# Patient Record
Sex: Male | Born: 1975
Health system: Southern US, Community
[De-identification: ages and names within clinical notes are randomized; demographics above are authoritative.]

## PROBLEM LIST (undated history)

## (undated) DIAGNOSIS — F419 Anxiety disorder, unspecified: Secondary | ICD-10-CM

## (undated) DIAGNOSIS — J45909 Unspecified asthma, uncomplicated: Secondary | ICD-10-CM

## (undated) HISTORY — PX: APPENDECTOMY: SHX54

---

## 2000-04-21 ENCOUNTER — Emergency Department (HOSPITAL_COMMUNITY): Admission: EM | Admit: 2000-04-21 | Discharge: 2000-04-21 | Payer: Self-pay | Admitting: Emergency Medicine

## 2000-06-07 ENCOUNTER — Emergency Department (HOSPITAL_COMMUNITY): Admission: EM | Admit: 2000-06-07 | Discharge: 2000-06-07 | Payer: Self-pay | Admitting: Emergency Medicine

## 2002-10-23 ENCOUNTER — Encounter: Payer: Self-pay | Admitting: *Deleted

## 2002-10-23 ENCOUNTER — Encounter (INDEPENDENT_AMBULATORY_CARE_PROVIDER_SITE_OTHER): Payer: Self-pay | Admitting: *Deleted

## 2002-10-23 ENCOUNTER — Inpatient Hospital Stay (HOSPITAL_COMMUNITY): Admission: EM | Admit: 2002-10-23 | Discharge: 2002-10-25 | Payer: Self-pay | Admitting: Emergency Medicine

## 2004-04-12 ENCOUNTER — Emergency Department (HOSPITAL_COMMUNITY): Admission: EM | Admit: 2004-04-12 | Discharge: 2004-04-12 | Payer: Self-pay | Admitting: Emergency Medicine

## 2010-03-07 ENCOUNTER — Emergency Department (HOSPITAL_COMMUNITY)
Admission: EM | Admit: 2010-03-07 | Discharge: 2010-03-07 | Payer: Self-pay | Source: Home / Self Care | Admitting: Emergency Medicine

## 2010-03-13 ENCOUNTER — Emergency Department (HOSPITAL_COMMUNITY)
Admission: EM | Admit: 2010-03-13 | Discharge: 2010-03-13 | Payer: Self-pay | Source: Home / Self Care | Admitting: Emergency Medicine

## 2010-07-25 NOTE — Op Note (Signed)
NAME:  Steve Harmon, Steve Harmon                            ACCOUNT NO.:  [id number]   MEDICAL RECORD NO.:  [id number]                   PATIENT TYPE:  INP   LOCATION:  5729                                 FACILITY:  [facility]   PHYSICIAN:  Velora Heckler, M.D.                DATE OF BIRTH:  [birth year]   DATE OF PROCEDURE:  10/23/2002  DATE OF DISCHARGE:                                 OPERATIVE REPORT   PREOPERATIVE DIAGNOSIS:  Acute appendicitis.   POSTOPERATIVE DIAGNOSIS:  Acute appendicitis.   PROCEDURE:  Laparoscopic appendectomy.   SURGEON:  Velora Heckler, M.D.   ANESTHESIA:  General per Janetta Hora. Gelene Mink, M.D.   ESTIMATED BLOOD LOSS:  Minimal.   PREPARATION:  Betadine.   COMPLICATIONS:  None.   INDICATIONS FOR PROCEDURE:  The patient is a 35 year old white male who  presents to the emergency department with abdominal pain localizing to the  right lower quadrant. White blood cell count is elevated at 16,000. A CT  scan of the abdomen and pelvis confirms acute appendicitis. The patient is  now brought to the room for appendectomy.   DESCRIPTION OF PROCEDURE:  The procedure was done in operating room #8 at  the [facility]. The patient was brought to the operating  room and placed in the supine position on the operating table. Following  the administration of general anesthesia the patient was prepped and draped  in the usual strict aseptic fashion.   After ascertaining that an adequate level  of anesthesia had been obtained,  a supraumbilical  incision was made with a #15 blade. Dissection was carried  down through the fascia. The fascia was incised in the midline. The  peritoneal cavity was entered cautiously. A 0 Vicryl pursestring suture was  placed in the fascia. A Hasson cannula was introduced and secured with the  pursestring suture. The abdomen was insufflated with CO2.   The laparoscope was introduced and the abdomen was explored. There was an  acutely inflamed appendix that appears to be somewhat necrotic near the tip.  It is densely adherent to the terminal ileum. It extends over the pelvic rim  into the pelvis. There is greenish colored fluid present within the pelvis.   Operative ports were placed in the right upper quadrant and left lower  quadrant. The appendix was then gently mobilized off of the terminal ileum.  The mesoappendix was taken down using the harmonic scalpel for hemostasis.  Dissection was carried down to the base of the appendix. The base of the  appendix was transected with an Endo GIA stapler with good hemostasis noted.  The appendix was placed into an EndoCatch bag and withdrawn through the left  lower quadrant port.   The abdomen was irrigated copiously with warm saline which was evacuated.  The ports were removed under direct vision and good hemostasis was noted at  all  port sites. Pneumoperitoneum was released. Then 0 Vicryl pursestring  suture at the umbilicus was tied securely. All port sites were anesthetized  with local anesthetic. All wounds were closed with interrupted Vicryl  subcuticular sutures. The wounds were washed and dried and Benzoin and Steri-  Strips were applied. Sterile gauze dressings were applied.   The patient was awakened from anesthesia and brought to the recovery room in  stable condition. The patient tolerated the procedure well.                                               Velora Heckler, M.D.    TMG/MEDQ  D:  10/23/2002  T:  10/23/2002  Job:  [id number]

## 2010-07-25 NOTE — Op Note (Signed)
NAME:  Steve Harmon, MANDICH                            ACCOUNT NO.:  [id number]   MEDICAL RECORD NO.:  [id number]                   PATIENT TYPE:  INP   LOCATION:  5729                                 FACILITY:  [facility]   PHYSICIAN:  Velora Heckler, M.D.                DATE OF BIRTH:  03-08-1976   DATE OF PROCEDURE:  10/23/2002  DATE OF DISCHARGE:                                 OPERATIVE REPORT   PREOPERATIVE DIAGNOSIS:  Acute appendicitis.   POSTOPERATIVE DIAGNOSIS:  Acute appendicitis.   OPERATION PERFORMED:  Laparoscopic appendectomy.   SURGEON:  Velora Heckler, M.D.   ANESTHESIA:  General.   ANESTHESIOLOGIST:  Janetta Hora. Gelene Mink, M.D.   ESTIMATED BLOOD LOSS:  Minimal.   PREPARATION:  Betadine.   COMPLICATIONS:  None.   INDICATIONS FOR PROCEDURE:  The patient is a 35 year old white male who  presents to the emergency department with a history of abdominal pain with  onset early on the morning of October 22, 2002.  This was generalized  abdominal pain but maximum discomfort in the right lower quadrant.  The  patient denies nausea or vomiting.  He denies fevers or chills although in  the emergency department he did have a temperature as high as 101.5 degrees.  The patient notes normal bowel movement yesterday.  He has had no prior  abdominal surgery.   PAST MEDICAL HISTORY:  Unremarkable.   MEDICATIONS:  None.   ALLERGIES:  ACTIFED causes hives.   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  He smokes less than a pack of cigarettes a day.  He drinks one to two  alcoholic beverages per day.   FAMILY HISTORY:  Notable for coronary artery disease in a paternal  grandfather.   REVIEW OF SYSTEMS:  Abdominal pain.  No nausea or vomiting.  No diarrhea or  constipation.  Otherwise 15-system review negative.   PHYSICAL EXAMINATION:  GENERAL:  A 35 year old thin white male in no acute  distress on stretcher in the holding area of the operating  room.  VITAL SIGNS:  Temperature 101.5, pulse 82, respiratory rate 18, blood  pressure 113/66.  HEENT:  He is normocephalic.  Sclerae area clear.  Conjunctivae are clear.  Dentition is good.  Voice is normal.  Neck is symmetric.  Palpation reveals  no thyroid nodularity.  There is no anterior or posterior cervical  lymphadenopathy.  There is no tenderness.  LUNGS:  Clear to auscultation.  There is no costovertebral angle tenderness.  There are no rales or rhonchi.  CARDIAC:  Regular rate and rhythm without murmur.  ABDOMEN:  A few scattered bowel sounds.  There is diffuse abdominal  tenderness both to palpation and percussion.  There is maximal tenderness in  the right lower quadrant approximately McBurney's point with voluntary  guarding.  EXTREMITIES:  Nontender without edema.  NEUROLOGICALLY:  The patient  is alert and oriented without focal deficit.   LABORATORY DATA:  Laboratory studies include a CBC with a white count of  16,800, hemoglobin 14.9, hematocrit 43.2%, platelet count 259,000;  differential shows 83% neutrophils.  Urinalysis is negative. CT scan,  abdomen and pelvis is reviewed.  This demonstrates findings consistent with  acute appendicitis without abscess.   IMPRESSION:  Acute appendicitis.   PLAN:  1. Admission to [facility].  2. Initiation of intravenous antibiotics.  3. To operating room for appendectomy.  4. Routine postoperative care.   I discussed at length with the patient the indications for surgery.  I  explained the technique of laparoscopic appendectomy versus open technique.  The patient understands and wishes to proceed.                                                Velora Heckler, M.D.    TMG/MEDQ  D:  10/23/2002  T:  10/23/2002  Job:  [id number]

## 2010-07-25 NOTE — Discharge Summary (Signed)
   NAMECANTON, YEARBY                            ACCOUNT NO.:  [id number]   MEDICAL RECORD NO.:  [id number]                   PATIENT TYPE:  INP   LOCATION:  5729                                 FACILITY:  [facility]   PHYSICIAN:  Velora Heckler, M.D.                DATE OF BIRTH:  1975/08/11   DATE OF ADMISSION:  10/22/2002  DATE OF DISCHARGE:  10/25/2002                                 DISCHARGE SUMMARY   ADMITTING DIAGNOSES:  Acute appendicitis.   BRIEF HISTORY:  The patient is a 35 year old white male who presented to the  emergency department with a 24-hour history of abdominal pain localizing to  the right lower quadrant.  The patient had a fever of 101.5 degrees.  White  blood cell count was elevated.  CT scan abdomen and pelvis demonstrated  findings consistent with acute appendicitis.   HOSPITAL COURSE:  The patient was seen in the emergency room and admitted on  the general surgical service.  He was prepared and taken to the operating  room on August 16 where he underwent laparoscopic appendectomy.  Postoperative course was uncomplicated.  He received 48 hours of intravenous  antibiotics.  He was advanced from clear liquids to a regular diet.  He was  prepared for discharge home on the second postoperative day.   DISCHARGE PLANNING:  The patient is discharged home October 25, 2002 in good  condition, tolerating a regular diet, and ambulating independently.  The  patient will be seen back in my office at [facility] in two  weeks.   DISCHARGE MEDICATIONS:  Vicodin as needed for pain.   FINAL DIAGNOSES:  Acute appendicitis.   CONDITION ON DISCHARGE:  Improved.                                                Velora Heckler, M.D.    TMG/MEDQ  D:  10/25/2002  T:  10/26/2002  Job:  [id number]

## 2016-04-12 ENCOUNTER — Encounter (HOSPITAL_COMMUNITY): Payer: Self-pay | Admitting: Emergency Medicine

## 2016-04-12 ENCOUNTER — Emergency Department (HOSPITAL_COMMUNITY)
Admission: EM | Admit: 2016-04-12 | Discharge: 2016-04-12 | Disposition: A | Discharge status: Home / Self Care | Payer: Self-pay | Attending: Emergency Medicine | Admitting: Emergency Medicine

## 2016-04-12 DIAGNOSIS — M545 Low back pain, unspecified: Secondary | ICD-10-CM

## 2016-04-12 DIAGNOSIS — F17200 Nicotine dependence, unspecified, uncomplicated: Secondary | ICD-10-CM | POA: Insufficient documentation

## 2016-04-12 MED ORDER — HYDROCODONE-ACETAMINOPHEN 5-325 MG PO TABS
1.0000 | ORAL_TABLET | Freq: Once | ORAL | Status: AC
  Administered 2016-04-12: 1 via ORAL
  Filled 2016-04-12: qty 1

## 2016-04-12 MED ORDER — IBUPROFEN 800 MG PO TABS: 800.0000 mg | ORAL_TABLET | Freq: Three times a day (TID) | ORAL | 0 refills | Status: DC

## 2016-04-12 MED ORDER — KETOROLAC TROMETHAMINE 60 MG/2ML IM SOLN
60.0000 mg | Freq: Once | INTRAMUSCULAR | Status: AC
  Administered 2016-04-12: 60 mg via INTRAMUSCULAR
  Filled 2016-04-12: qty 2

## 2016-04-12 MED ORDER — METHOCARBAMOL 500 MG PO TABS: 500.0000 mg | ORAL_TABLET | Freq: Two times a day (BID) | ORAL | 0 refills | Status: DC

## 2016-04-12 MED ORDER — METHOCARBAMOL 500 MG PO TABS
500.0000 mg | ORAL_TABLET | Freq: Once | ORAL | Status: AC
  Administered 2016-04-12: 500 mg via ORAL
  Filled 2016-04-12: qty 1

## 2016-04-12 NOTE — ED Provider Notes (Signed)
[facility]-EMERGENCY DEPT Provider Note   CSN: [id number] Arrival date & time: 04/12/16  1259  By signing my name below, I, Arianna Nassar, attest that this documentation has been prepared under the direction and in the presence of Marily MemosJason Kym Fenter, MD.  Electronically Signed: Octavia HeirArianna Nassar, ED Scribe. 04/12/16. 1:57 PM.    History   Chief Complaint Chief Complaint  Patient presents with  . Back Pain   The history is provided by the patient. No language interpreter was used.   HPI Comments: Despina HickLee A Passero is a 41 y.o. male brought in by ambulance, who presents to the Emergency Department complaining of gradual worsening, moderate lower back pain that began yesterday morning. He describes the pain as a "twisting" sensation. Pt expresses associated weakness and radiation of pain in his bilateral legs upon standing. He reports bending over yesterday to look at an object at [employer] and notes "not being able to stand back up". He has not taken any medication to alleviate his pain. Pt reports increased pain when ambulating and with certain movements. He notes having a hx of pulled muscles in his back but says "this is more severe". Pt denies fever, dysuria, urinary frequency, IV drug use, ETOH use.  History reviewed. No pertinent past medical history.  There are no active problems to display for this patient.   History reviewed. No pertinent surgical history.     Home Medications    Prior to Admission medications   Medication Sig Start Date End Date Taking? Authorizing Provider  ibuprofen (ADVIL,MOTRIN) 800 MG tablet Take 1 tablet (800 mg total) by mouth 3 (three) times daily. 04/12/16   Marily MemosJason Avelardo Reesman, MD  methocarbamol (ROBAXIN) 500 MG tablet Take 1 tablet (500 mg total) by mouth 2 (two) times daily. 04/12/16   Marily MemosJason Nicolaus Andel, MD    Family History History reviewed. No pertinent family history.  Social History Social History  Substance Use Topics  . Smoking status: Current Every Day Smoker  .  Smokeless tobacco: Not on file  . Alcohol use No     Allergies   Patient has no known allergies.   Review of Systems Review of Systems  Constitutional: Negative for fever.  Genitourinary: Negative for dysuria and frequency.  Musculoskeletal: Positive for back pain.  Neurological: Positive for weakness.  All other systems reviewed and are negative.    Physical Exam Updated Vital Signs BP 121/73 (BP Location: Right Arm)   Pulse 60   Temp 98.5 F (36.9 C) (Oral)   Resp 18   Ht 5\' 10"  (1.778 m)   Wt 145 lb (65.8 kg)   SpO2 100%   BMI 20.81 kg/m   Physical Exam  Constitutional: He is oriented to person, place, and time. He appears well-developed and well-nourished.  HENT:  Head: Normocephalic.  Eyes: EOM are normal.  Neck: Normal range of motion.  Cardiovascular: Normal rate and regular rhythm.   Pulmonary/Chest: Effort normal.  Abdominal: He exhibits no distension.  Musculoskeletal: Normal range of motion. He exhibits tenderness.  Upper gluteal tenderness to the lower back, sensation intact  Neurological: He is alert and oriented to person, place, and time. No cranial nerve deficit. Coordination normal.  Normal patellar relexes Normal lower extremity strength (flexion/extension and of knee, ankle and hip, normal dorsi/plantar flexion of ankle) Normal lower extremity sensation  No saddle anesthesia  Skin: Skin is warm and dry. No rash noted.  Psychiatric: He has a normal mood and affect.  Nursing note and vitals reviewed.  ED Treatments / Results  DIAGNOSTIC STUDIES: Oxygen Saturation is 100% on RA, normal by my interpretation.  COORDINATION OF CARE:  1:53 PM Discussed treatment plan with pt at bedside and pt agreed to plan.  Labs (all labs ordered are listed, but only abnormal results are displayed) Labs Reviewed - No data to display  EKG  EKG Interpretation None       Radiology No results found.  Procedures Procedures (including critical care  time)  Medications Ordered in ED Medications  HYDROcodone-acetaminophen (NORCO/VICODIN) 5-325 MG per tablet 1 tablet (not administered)  ketorolac (TORADOL) injection 60 mg (not administered)  methocarbamol (ROBAXIN) tablet 500 mg (not administered)     Initial Impression / Assessment and Plan / ED Course  I have reviewed the triage vital signs and the nursing notes.  Pertinent labs & imaging results that were available during my care of the patient were reviewed by me and considered in my medical decision making (see chart for details).     No red flags. Suspect MSK cause. No indication for imaging at this time. Will treat as such. Will return if not improving in a few days.   Final Clinical Impressions(s) / ED Diagnoses   Final diagnoses:  Acute bilateral low back pain without sciatica    New Prescriptions New Prescriptions   IBUPROFEN (ADVIL,MOTRIN) 800 MG TABLET    Take 1 tablet (800 mg total) by mouth 3 (three) times daily.   METHOCARBAMOL (ROBAXIN) 500 MG TABLET    Take 1 tablet (500 mg total) by mouth 2 (two) times daily.   I personally performed the services described in this documentation, which was scribed in my presence. The recorded information has been reviewed and is accurate.    Marily Memos, MD 04/12/16 [id number]

## 2016-04-12 NOTE — ED Triage Notes (Signed)
EMS stated, Hes c/o lower back onset yesterday.

## 2016-04-12 NOTE — ED Triage Notes (Signed)
Pt arrived by ems, reports severe back to entire back. Denies injury.

## 2016-04-12 NOTE — ED Notes (Signed)
Declined W/C at D/C and was escorted to lobby by RN. 

## 2017-01-05 ENCOUNTER — Emergency Department (HOSPITAL_COMMUNITY)
Admission: EM | Admit: 2017-01-05 | Discharge: 2017-01-05 | Disposition: A | Discharge status: Home / Self Care | Payer: Self-pay | Attending: Emergency Medicine | Admitting: Emergency Medicine

## 2017-01-05 ENCOUNTER — Emergency Department (HOSPITAL_COMMUNITY): Payer: Self-pay

## 2017-01-05 ENCOUNTER — Encounter (HOSPITAL_COMMUNITY): Payer: Self-pay | Admitting: *Deleted

## 2017-01-05 DIAGNOSIS — Z79899 Other long term (current) drug therapy: Secondary | ICD-10-CM | POA: Insufficient documentation

## 2017-01-05 DIAGNOSIS — F17200 Nicotine dependence, unspecified, uncomplicated: Secondary | ICD-10-CM | POA: Insufficient documentation

## 2017-01-05 DIAGNOSIS — M25522 Pain in left elbow: Secondary | ICD-10-CM | POA: Insufficient documentation

## 2017-01-05 NOTE — ED Notes (Signed)
Patient transported to X-ray 

## 2017-01-05 NOTE — ED Provider Notes (Signed)
[facility] Provider Note   CSN: [id number] Arrival date & time: 01/05/17  1614     History   Chief Complaint Chief Complaint  Patient presents with  . Arm Pain    HPI Steve Harmon is a 41 y.o. male.  HPI  41 y.o. male, presents to the Emergency Department today due to left elbow pain. Notes radiation of pain to finger. Notes worsening with movement. Pt states intermittent numbness to fingers. Pt states that this pain occurred x 3 months ago. Denies injury to area. 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 Notes heat over area only dulls pain, but symptom still present. No redness to area. No swelling. States that there is a knot on the inside of his elbow that is TTP and moves. Rates 6/10 pain with palpation and sharp. No other symptoms noted.      History reviewed. No pertinent past medical history.  There are no active problems to display for this patient.   History reviewed. No pertinent surgical history.     Home Medications    Prior to Admission medications   Medication Sig Start Date End Date Taking? Authorizing Provider  ibuprofen (ADVIL,MOTRIN) 800 MG tablet Take 1 tablet (800 mg total) by mouth 3 (three) times daily. 04/12/16   Mesner, Barbara CowerJason, MD  methocarbamol (ROBAXIN) 500 MG tablet Take 1 tablet (500 mg total) by mouth 2 (two) times daily. 04/12/16   Mesner, Barbara CowerJason, MD    Family History No family history on file.  Social History Social History  Substance Use Topics  . Smoking status: Current Every Day Smoker  . Smokeless tobacco: Not on file  . Alcohol use No     Allergies   Patient has no known allergies.   Review of Systems Review of Systems ROS reviewed and all are negative for acute change except as noted in the HPI.  Physical Exam Updated Vital Signs BP 114/74   Pulse 70   Temp 98.7 F (37.1 C) (Oral)   Resp 16   SpO2 100%   Physical Exam  Constitutional:  He is oriented to person, place, and time. Vital signs are normal. He appears well-developed and well-nourished.  HENT:  Head: Normocephalic.  Right Ear: Hearing normal.  Left Ear: Hearing normal.  Eyes: Pupils are equal, round, and reactive to light. Conjunctivae and EOM are normal.  Neck: Normal range of motion. Neck supple.  Cardiovascular: Normal rate and regular rhythm.   Pulmonary/Chest: Effort normal.  Musculoskeletal: Normal range of motion.  Left elbow with palpable cystic mass on medial aspect of elbow. Located superior to ulnar nerve distribution. Area TTP. No fluctuance. No erythema.   Neurological: He is alert and oriented to person, place, and time.  BUE sensation intact. Grip strength slightly decreased on left hand compared to right.    Skin: Skin is warm and dry.  Psychiatric: He has a normal mood and affect. His speech is normal and behavior is normal. Thought content normal.  Nursing note and vitals reviewed.    ED Treatments / Results  Labs (all labs ordered are listed, but only abnormal results are displayed) Labs Reviewed - No data to display  EKG  EKG Interpretation None       Radiology Dg Elbow Complete Left  Result Date: 01/05/2017 CLINICAL DATA:  To ED for eval of left elbow pain with radiation to finger. Pain increases with movement. Makes fingers numb sometimes. This  pain started 3 months ago. No injury EXAM: LEFT ELBOW - COMPLETE 3+ VIEW COMPARISON:  None. FINDINGS: No acute fracture or dislocation. No joint effusion. Joint spaces maintained. IMPRESSION: No acute osseous abnormality. Electronically Signed   By: Jeronimo Greaves M.D.   On: 01/05/2017 18:04    Procedures Procedures (including critical care time)  Medications Ordered in ED Medications - No data to display   Initial Impression / Assessment and Plan / ED Course  I have reviewed the triage vital signs and the nursing notes.  Pertinent labs & imaging results that were available during  my care of the patient were reviewed by me and considered in my medical decision making (see chart for details).  Final Clinical Impressions(s) / ED Diagnoses   {I have reviewed and evaluated the relevant imaging studies.  {I have reviewed the relevant previous healthcare records.  {I obtained HPI from historian.   ED Course:  Assessment: Patient X-Ray negative for obvious fracture or dislocation. Likely cyst or tendonitis causing swelling against ulnar nerve. Doubt blood clot. NVI. Pulses appreciated. Pt advised to follow up with orthopedics. Conservative therapy recommended and discussed. Patient will be discharged home & is agreeable with above plan. Returns precautions discussed. Pt appears safe for discharge  Disposition/Plan:  DC Home Additional Verbal discharge instructions given and discussed with patient.  Pt Instructed to f/u with Ortho in the next week for evaluation and treatment of symptoms. Return precautions given Pt acknowledges and agrees with plan  Supervising Physician Cardama, Amadeo Garnet, *  Final diagnoses:  Left elbow pain    New Prescriptions New Prescriptions   No medications on file     Audry Pili, Cordelia Poche 01/05/17 1823    Nira Conn, MD 01/05/17 2350

## 2017-01-05 NOTE — ED Triage Notes (Signed)
To ED for eval of left elbow pain with radiation to finger. Pain increases with movement. Makes fingers numb sometimes. This pain started 3 months ago. No injury

## 2017-11-13 ENCOUNTER — Other Ambulatory Visit: Payer: Self-pay

## 2017-11-13 ENCOUNTER — Emergency Department (HOSPITAL_COMMUNITY): Payer: BLUE CROSS/BLUE SHIELD

## 2017-11-13 ENCOUNTER — Emergency Department (HOSPITAL_COMMUNITY)
Admission: EM | Admit: 2017-11-13 | Discharge: 2017-11-13 | Disposition: A | Discharge status: Home / Self Care | Payer: BLUE CROSS/BLUE SHIELD | Attending: Emergency Medicine | Admitting: Emergency Medicine

## 2017-11-13 ENCOUNTER — Encounter (HOSPITAL_COMMUNITY): Payer: Self-pay

## 2017-11-13 DIAGNOSIS — F17200 Nicotine dependence, unspecified, uncomplicated: Secondary | ICD-10-CM | POA: Diagnosis not present

## 2017-11-13 DIAGNOSIS — S82392A Other fracture of lower end of left tibia, initial encounter for closed fracture: Secondary | ICD-10-CM | POA: Insufficient documentation

## 2017-11-13 DIAGNOSIS — Y998 Other external cause status: Secondary | ICD-10-CM | POA: Insufficient documentation

## 2017-11-13 DIAGNOSIS — S82892A Other fracture of left lower leg, initial encounter for closed fracture: Secondary | ICD-10-CM

## 2017-11-13 DIAGNOSIS — Y9289 Other specified places as the place of occurrence of the external cause: Secondary | ICD-10-CM | POA: Insufficient documentation

## 2017-11-13 DIAGNOSIS — W132XXA Fall from, out of or through roof, initial encounter: Secondary | ICD-10-CM | POA: Diagnosis not present

## 2017-11-13 DIAGNOSIS — Y9389 Activity, other specified: Secondary | ICD-10-CM | POA: Insufficient documentation

## 2017-11-13 DIAGNOSIS — S99922A Unspecified injury of left foot, initial encounter: Secondary | ICD-10-CM | POA: Diagnosis present

## 2017-11-13 MED ORDER — HYDROCODONE-ACETAMINOPHEN 5-325 MG PO TABS: 1.0000 | ORAL_TABLET | Freq: Four times a day (QID) | ORAL | 0 refills | Status: DC | PRN

## 2017-11-13 MED ORDER — IBUPROFEN 800 MG PO TABS: 800.0000 mg | ORAL_TABLET | Freq: Three times a day (TID) | ORAL | 0 refills | Status: DC

## 2017-11-13 NOTE — ED Provider Notes (Signed)
[facility] EMERGENCY DEPARTMENT Provider Note   CSN: [id number] Arrival date & time: 11/13/17  1415     History   Chief Complaint Chief Complaint  Patient presents with  . Foot Pain    HPI Steve Harmon is a 42 y.o. male.  HPI Pt presents to the ED with complaints of left foot and ankle pain.  Pt was working on a roof yesterday when he fell off and landed on his left foot onto concrete.  Pt was able to make it home.  His wife convinved him to get it checked today since it became more swollen and he is having trouble putting any weight on it.  No weakness or numbness.  No back pain.  No other complaints. History reviewed. No pertinent past medical history.  There are no active problems to display for this patient.   History reviewed. No pertinent surgical history.      Home Medications    Prior to Admission medications   Medication Sig Start Date End Date Taking? Authorizing Provider  HYDROcodone-acetaminophen (NORCO/VICODIN) 5-325 MG tablet Take 1 tablet by mouth every 6 (six) hours as needed. 11/13/17   Linwood Dibbles, MD  ibuprofen (ADVIL,MOTRIN) 800 MG tablet Take 1 tablet (800 mg total) by mouth 3 (three) times daily. 11/13/17   Linwood Dibbles, MD  methocarbamol (ROBAXIN) 500 MG tablet Take 1 tablet (500 mg total) by mouth 2 (two) times daily. 04/12/16   Mesner, Barbara Cower, MD    Family History History reviewed. No pertinent family history.  Social History Social History   Tobacco Use  . Smoking status: Current Every Day Smoker  . Smokeless tobacco: Never Used  Substance Use Topics  . Alcohol use: No  . Drug use: No     Allergies   Patient has no known allergies.   Review of Systems Review of Systems  All other systems reviewed and are negative.    Physical Exam Updated Vital Signs BP 115/77 (BP Location: Right Arm)   Pulse (!) 58   Temp 98.7 F (37.1 C) (Oral)   Resp 16   Ht 1.778 m (5\' 10" )   Wt 65.8 kg   SpO2 99%   BMI 20.81 kg/m    Physical Exam  Constitutional: He appears well-developed and well-nourished. No distress.  HENT:  Head: Normocephalic and atraumatic.  Right Ear: External ear normal.  Left Ear: External ear normal.  Eyes: Conjunctivae are normal. Right eye exhibits no discharge. Left eye exhibits no discharge. No scleral icterus.  Neck: Neck supple. No tracheal deviation present.  Cardiovascular: Normal rate.  Pulmonary/Chest: Effort normal. No stridor. No respiratory distress.  Abdominal: He exhibits no distension.  Musculoskeletal: He exhibits no edema.       Left knee: Normal.       Left ankle: He exhibits decreased range of motion and swelling. Tenderness. Lateral malleolus and medial malleolus tenderness found.       Left foot: There is tenderness, bony tenderness and swelling.  Neurological: He is alert. Cranial nerve deficit: no gross deficits.  Skin: Skin is warm and dry. No rash noted.  Psychiatric: He has a normal mood and affect.  Nursing note and vitals reviewed.    ED Treatments / Results   Radiology Dg Ankle Complete Left  Result Date: 11/13/2017 CLINICAL DATA:  Left ankle pain after falling off his roof. EXAM: LEFT ANKLE COMPLETE - 3+ VIEW COMPARISON:  Left foot radiographs obtained at the same time. Left ankle dated 04/12/2004. FINDINGS:  Comminuted fracture in the distal tibia involving the epiphysis, metaphysis and metadiaphysis. This extends into the talotibial joint. There is a mildly proximally displaced middle fragment. There is also a mildly anteriorly and proximally displaced anterior fragment. There is an associated ankle joint effusion. The lateral malleolus and posterior malleolus are intact. IMPRESSION: Comminuted distal tibia fracture, as described above. Electronically Signed   By: Beckie Salts M.D.   On: 11/13/2017 15:35   Dg Foot Complete Left  Result Date: 11/13/2017 CLINICAL DATA:  Left ankle pain and deformity after falling off his roof. EXAM: LEFT FOOT - COMPLETE  3+ VIEW COMPARISON:  Left ankle radiographs obtained at the same time. FINDINGS: Essentially nondisplaced, comminuted distal tibia fracture extending into the talotibial joint. No foot fracture or dislocation seen. IMPRESSION: Comminuted distal tibia fracture with intra-articular extension. Electronically Signed   By: Beckie Salts M.D.   On: 11/13/2017 15:33    Procedures .Splint Application Date/Time: 11/13/2017 4:49 PM Performed by: Linwood Dibbles, MD Authorized by: Linwood Dibbles, MD   Post-procedure details:    Sensation:  Normal   Skin color:  Normal   Patient tolerance of procedure:  Tolerated well, no immediate complications Comments:     Splint applied by ortho tech, short leg with stirrup   (including critical care time)  Medications Ordered in ED Medications - No data to display   Initial Impression / Assessment and Plan / ED Course  I have reviewed the triage vital signs and the nursing notes.  Pertinent labs & imaging results that were available during my care of the patient were reviewed by me and considered in my medical decision making (see chart for details).   Patient appears to have a confined left ankle injury.  X-rays are consistent with a comminuted distal tibia fracture.. Patient is neurovascularly intact.  Patient was splinted by the orthopedic tech.  Stable for outpatient follow-up with orthopedics.  Final Clinical Impressions(s) / ED Diagnoses   Final diagnoses:  Closed fracture of left ankle, initial encounter    ED Discharge Orders         Ordered    ibuprofen (ADVIL,MOTRIN) 800 MG tablet  3 times daily     11/13/17 1647    HYDROcodone-acetaminophen (NORCO/VICODIN) 5-325 MG tablet  Every 6 hours PRN     11/13/17 1647           Linwood Dibbles, MD 11/13/17 1650

## 2017-11-13 NOTE — ED Triage Notes (Signed)
Pt presents with pain and swelling to L foot/ankle since yesterday.  Pt was putting tar on roof when he fell off, landing on L foot on concrete, pt did not fall backwards; unable to bear weight.

## 2017-11-13 NOTE — Discharge Instructions (Signed)
Follow-up with the orthopedic doctor, not put any weight on your left leg and make sure to use the crutches, try to keep the leg elevated when you are able, apply ice to help with the swelling, take the medications as needed for pain

## 2017-11-15 ENCOUNTER — Encounter (HOSPITAL_COMMUNITY): Payer: Self-pay

## 2017-11-15 ENCOUNTER — Other Ambulatory Visit (HOSPITAL_COMMUNITY): Payer: Self-pay | Admitting: Student

## 2017-11-15 ENCOUNTER — Ambulatory Visit (HOSPITAL_COMMUNITY)
Admission: RE | Admit: 2017-11-15 | Discharge: 2017-11-15 | Disposition: A | Discharge status: Home / Self Care | Payer: BLUE CROSS/BLUE SHIELD | Source: Ambulatory Visit | Attending: Student | Admitting: Student

## 2017-11-15 DIAGNOSIS — X58XXXA Exposure to other specified factors, initial encounter: Secondary | ICD-10-CM | POA: Diagnosis not present

## 2017-11-15 DIAGNOSIS — S82302A Unspecified fracture of lower end of left tibia, initial encounter for closed fracture: Secondary | ICD-10-CM | POA: Insufficient documentation

## 2017-11-15 DIAGNOSIS — S82872A Displaced pilon fracture of left tibia, initial encounter for closed fracture: Secondary | ICD-10-CM

## 2017-11-22 ENCOUNTER — Ambulatory Visit: Payer: Self-pay | Admitting: Student

## 2017-11-22 DIAGNOSIS — S82872A Displaced pilon fracture of left tibia, initial encounter for closed fracture: Secondary | ICD-10-CM | POA: Insufficient documentation

## 2017-11-23 ENCOUNTER — Encounter (HOSPITAL_COMMUNITY): Payer: Self-pay | Admitting: *Deleted

## 2017-11-23 ENCOUNTER — Other Ambulatory Visit: Payer: Self-pay

## 2017-11-23 NOTE — H&P (Signed)
Orthopaedic Trauma Service (OTS) H&P  Patient ID: Steve Harmon MRN: [id number] DOB/AGE: 41/11/1975 41 y.o.  Reason for Surgery: Left pilon fracture  HPI: Steve HickLee A Labarbera is an 42 y.o. male who sustained left pilon fracture. Presented as outpatient and obtained CT scan for preoperative planning. Denies any major medical problems  No past medical history on file.  No past surgical history on file.  No family history on file.  Social History:  reports that he has been smoking. He has never used smokeless tobacco. He reports that he does not drink alcohol or use drugs.  Allergies: No Known Allergies  Medications:  No current facility-administered medications on file prior to encounter.    Current Outpatient Medications on File Prior to Encounter  Medication Sig Dispense Refill  . HYDROcodone-acetaminophen (NORCO/VICODIN) 5-325 MG tablet Take 1 tablet by mouth every 6 (six) hours as needed. (Patient not taking: Reported on 11/22/2017) 12 tablet 0  . ibuprofen (ADVIL,MOTRIN) 800 MG tablet Take 1 tablet (800 mg total) by mouth 3 (three) times daily. (Patient not taking: Reported on 11/22/2017) 21 tablet 0  . methocarbamol (ROBAXIN) 500 MG tablet Take 1 tablet (500 mg total) by mouth 2 (two) times daily. (Patient not taking: Reported on 11/22/2017) 20 tablet 0     ROS: Constitutional: No fever or chills Vision: No changes in vision ENT: No difficulty swallowing CV: No chest pain Pulm: No SOB or wheezing GI: No nausea or vomiting GU: No urgency or inability to hold urine Skin: No poor wound healing Neurologic: No numbness or tingling Psychiatric: No depression or anxiety Heme: No bruising Allergic: No reaction to medications or food   Exam: There were no vitals taken for this visit. General:NAD Orientation:AAOx3 Mood and Affect: Cooperative and appropriate Gait: Unable to assess due to fracture  Coordination and balance: Within normal limits  Injured Extremity (CV, lymph,  sensation, reflexes): LLE: Swelling about ankle, does have skin wrinkling. Limited motion at ankle due to pain. Motor and sensory intact.  Medical Decision Making: Imaging: Left pilon fracture with central impaction  Labs: None  Medical history and chart was reviewed  Assessment/Plan: 42 year old male with left pilon fracture  Due to displaced and articular involvement I recommend proceeding with ORIF. Risks and benefits discussed. Risks discussed included bleeding requiring blood transfusion, bleeding causing a hematoma, infection, malunion, nonunion, damage to surrounding nerves and blood vessels, pain, hardware prominence or irritation, hardware failure, stiffness, post-traumatic arthritis, DVT/PE, compartment syndrome, and even death. Patient agrees to proceed with surgery and consent was obtained.   Roby LoftsKevin P. Haddix, MD [facility] [phone] (phone)

## 2017-11-23 NOTE — Anesthesia Preprocedure Evaluation (Addendum)
Anesthesia Evaluation  Patient identified by MRN, date of birth, ID band Patient awake    Reviewed: Allergy & Precautions, H&P , NPO status , Patient's Chart, lab work & pertinent test results  Airway Mallampati: II  TM Distance: >3 FB Neck ROM: Full    Dental no notable dental hx. (+) Poor Dentition, Dental Advisory Given   Pulmonary asthma , Current Smoker,    Pulmonary exam normal breath sounds clear to auscultation       Cardiovascular Exercise Tolerance: Good negative cardio ROS   Rhythm:Regular Rate:Normal     Neuro/Psych Anxiety negative neurological ROS  negative psych ROS   GI/Hepatic negative GI ROS, Neg liver ROS,   Endo/Other  negative endocrine ROS  Renal/GU negative Renal ROS  negative genitourinary   Musculoskeletal   Abdominal   Peds  Hematology negative hematology ROS (+)   Anesthesia Other Findings   Reproductive/Obstetrics negative OB ROS                            Anesthesia Physical Anesthesia Plan  ASA: II  Anesthesia Plan: General   Post-op Pain Management:  Regional for Post-op pain   Induction: Intravenous  PONV Risk Score and Plan: 2 and Ondansetron, Dexamethasone and Midazolam  Airway Management Planned: LMA  Additional Equipment:   Intra-op Plan:   Post-operative Plan: Extubation in OR  Informed Consent: I have reviewed the patients History and Physical, chart, labs and discussed the procedure including the risks, benefits and alternatives for the proposed anesthesia with the patient or authorized representative who has indicated his/her understanding and acceptance.   Dental advisory given  Plan Discussed with: CRNA  Anesthesia Plan Comments:         Anesthesia Quick Evaluation

## 2017-11-24 ENCOUNTER — Encounter (HOSPITAL_COMMUNITY)
Admission: RE | Disposition: A | Discharge status: Home / Self Care | Payer: Self-pay | Source: Ambulatory Visit | Attending: Student

## 2017-11-24 ENCOUNTER — Ambulatory Visit (HOSPITAL_COMMUNITY): Payer: BLUE CROSS/BLUE SHIELD | Admitting: Anesthesiology

## 2017-11-24 ENCOUNTER — Observation Stay (HOSPITAL_COMMUNITY): Payer: BLUE CROSS/BLUE SHIELD

## 2017-11-24 ENCOUNTER — Encounter (HOSPITAL_COMMUNITY): Payer: Self-pay | Admitting: Anesthesiology

## 2017-11-24 ENCOUNTER — Observation Stay (HOSPITAL_COMMUNITY)
Admission: RE | Admit: 2017-11-24 | Discharge: 2017-11-25 | Disposition: A | Discharge status: Home / Self Care | Payer: BLUE CROSS/BLUE SHIELD | Source: Ambulatory Visit | Attending: Student | Admitting: Student

## 2017-11-24 DIAGNOSIS — Y9289 Other specified places as the place of occurrence of the external cause: Secondary | ICD-10-CM | POA: Diagnosis not present

## 2017-11-24 DIAGNOSIS — S82872A Displaced pilon fracture of left tibia, initial encounter for closed fracture: Secondary | ICD-10-CM | POA: Diagnosis not present

## 2017-11-24 DIAGNOSIS — F17200 Nicotine dependence, unspecified, uncomplicated: Secondary | ICD-10-CM | POA: Insufficient documentation

## 2017-11-24 DIAGNOSIS — W1789XA Other fall from one level to another, initial encounter: Secondary | ICD-10-CM | POA: Diagnosis not present

## 2017-11-24 DIAGNOSIS — Z419 Encounter for procedure for purposes other than remedying health state, unspecified: Secondary | ICD-10-CM

## 2017-11-24 DIAGNOSIS — T148XXA Other injury of unspecified body region, initial encounter: Secondary | ICD-10-CM

## 2017-11-24 HISTORY — PX: OTHER SURGICAL HISTORY: SHX169

## 2017-11-24 HISTORY — PX: OPEN REDUCTION INTERNAL FIXATION (ORIF) TIBIA/FIBULA FRACTURE: SHX5992

## 2017-11-24 HISTORY — DX: Anxiety disorder, unspecified: F41.9

## 2017-11-24 HISTORY — DX: Unspecified asthma, uncomplicated: J45.909

## 2017-11-24 LAB — HEMOGLOBIN: Hemoglobin: 15 g/dL (ref 13.0–17.0)

## 2017-11-24 SURGERY — OPEN REDUCTION INTERNAL FIXATION (ORIF) TIBIA/FIBULA FRACTURE
Anesthesia: Regional | Site: Leg Lower | Laterality: Left

## 2017-11-24 MED ORDER — DEXAMETHASONE SODIUM PHOSPHATE 10 MG/ML IJ SOLN
INTRAMUSCULAR | Status: AC
  Filled 2017-11-24: qty 1

## 2017-11-24 MED ORDER — CHLORHEXIDINE GLUCONATE 4 % EX LIQD: 60.0000 mL | Freq: Once | CUTANEOUS | Status: DC

## 2017-11-24 MED ORDER — ACETAMINOPHEN 500 MG PO TABS
500.0000 mg | ORAL_TABLET | Freq: Two times a day (BID) | ORAL | Status: DC
  Administered 2017-11-24 – 2017-11-25 (×3): 500 mg via ORAL
  Filled 2017-11-24 (×3): qty 1

## 2017-11-24 MED ORDER — LACTATED RINGERS IV SOLN
INTRAVENOUS | Status: DC
  Administered 2017-11-24 (×2): via INTRAVENOUS

## 2017-11-24 MED ORDER — VANCOMYCIN HCL 1000 MG IV SOLR
INTRAVENOUS | Status: DC | PRN
  Administered 2017-11-24: 1000 mg via TOPICAL

## 2017-11-24 MED ORDER — VANCOMYCIN HCL 1000 MG IV SOLR
INTRAVENOUS | Status: AC
  Filled 2017-11-24: qty 1000

## 2017-11-24 MED ORDER — HYDROMORPHONE HCL 1 MG/ML IJ SOLN
1.0000 mg | INTRAMUSCULAR | Status: DC | PRN
  Administered 2017-11-25: 1 mg via INTRAVENOUS
  Filled 2017-11-24: qty 1

## 2017-11-24 MED ORDER — DEXAMETHASONE SODIUM PHOSPHATE 10 MG/ML IJ SOLN
INTRAMUSCULAR | Status: DC | PRN
  Administered 2017-11-24: 10 mg via INTRAVENOUS

## 2017-11-24 MED ORDER — OXYCODONE-ACETAMINOPHEN 5-325 MG PO TABS
1.0000 | ORAL_TABLET | ORAL | Status: DC | PRN
  Administered 2017-11-24 – 2017-11-25 (×3): 1 via ORAL
  Filled 2017-11-24 (×3): qty 1

## 2017-11-24 MED ORDER — PROPOFOL 10 MG/ML IV BOLUS
INTRAVENOUS | Status: DC | PRN
  Administered 2017-11-24: 50 mg via INTRAVENOUS
  Administered 2017-11-24: 150 mg via INTRAVENOUS

## 2017-11-24 MED ORDER — HYDROMORPHONE HCL 1 MG/ML IJ SOLN
INTRAMUSCULAR | Status: AC
  Filled 2017-11-24: qty 1

## 2017-11-24 MED ORDER — FENTANYL CITRATE (PF) 250 MCG/5ML IJ SOLN
INTRAMUSCULAR | Status: AC
  Filled 2017-11-24: qty 5

## 2017-11-24 MED ORDER — MIDAZOLAM HCL 2 MG/ML PO SYRP
ORAL_SOLUTION | ORAL | Status: AC
  Filled 2017-11-24: qty 2

## 2017-11-24 MED ORDER — BUPIVACAINE-EPINEPHRINE (PF) 0.5% -1:200000 IJ SOLN
INTRAMUSCULAR | Status: DC | PRN
  Administered 2017-11-24: 30 mL via PERINEURAL

## 2017-11-24 MED ORDER — OXYCODONE-ACETAMINOPHEN 5-325 MG PO TABS: 2.0000 | ORAL_TABLET | Freq: Four times a day (QID) | ORAL | Status: DC | PRN

## 2017-11-24 MED ORDER — FENTANYL CITRATE (PF) 100 MCG/2ML IJ SOLN
INTRAMUSCULAR | Status: DC | PRN
  Administered 2017-11-24 (×3): 50 ug via INTRAVENOUS

## 2017-11-24 MED ORDER — MIDAZOLAM HCL 5 MG/5ML IJ SOLN
INTRAMUSCULAR | Status: DC | PRN
  Administered 2017-11-24: 2 mg via INTRAVENOUS

## 2017-11-24 MED ORDER — FENTANYL CITRATE (PF) 100 MCG/2ML IJ SOLN
INTRAMUSCULAR | Status: AC
  Administered 2017-11-24: 100 ug via INTRAVENOUS
  Filled 2017-11-24: qty 2

## 2017-11-24 MED ORDER — FENTANYL CITRATE (PF) 100 MCG/2ML IJ SOLN
100.0000 ug | Freq: Once | INTRAMUSCULAR | Status: AC
  Administered 2017-11-24: 100 ug via INTRAVENOUS

## 2017-11-24 MED ORDER — HYDROMORPHONE HCL 1 MG/ML IJ SOLN
0.2500 mg | INTRAMUSCULAR | Status: DC | PRN
  Administered 2017-11-24 (×2): 0.5 mg via INTRAVENOUS

## 2017-11-24 MED ORDER — ONDANSETRON HCL 4 MG/2ML IJ SOLN
INTRAMUSCULAR | Status: AC
  Filled 2017-11-24: qty 2

## 2017-11-24 MED ORDER — CEFAZOLIN SODIUM-DEXTROSE 2-4 GM/100ML-% IV SOLN
2.0000 g | INTRAVENOUS | Status: AC
  Administered 2017-11-24: 2 g via INTRAVENOUS
  Filled 2017-11-24: qty 100

## 2017-11-24 MED ORDER — PROPOFOL 10 MG/ML IV BOLUS
INTRAVENOUS | Status: AC
  Filled 2017-11-24: qty 40

## 2017-11-24 MED ORDER — TOBRAMYCIN SULFATE 1.2 G IJ SOLR
INTRAMUSCULAR | Status: AC
  Filled 2017-11-24: qty 1.2

## 2017-11-24 MED ORDER — MIDAZOLAM HCL 2 MG/2ML IJ SOLN
2.0000 mg | Freq: Once | INTRAMUSCULAR | Status: AC
  Administered 2017-11-24: 2 mg via INTRAVENOUS

## 2017-11-24 MED ORDER — TOBRAMYCIN SULFATE 1.2 G IJ SOLR
INTRAMUSCULAR | Status: DC | PRN
  Administered 2017-11-24: 1.2 g via TOPICAL

## 2017-11-24 MED ORDER — 0.9 % SODIUM CHLORIDE (POUR BTL) OPTIME
TOPICAL | Status: DC | PRN
  Administered 2017-11-24: 1000 mL

## 2017-11-24 MED ORDER — MIDAZOLAM HCL 2 MG/2ML IJ SOLN
INTRAMUSCULAR | Status: AC
  Filled 2017-11-24: qty 2

## 2017-11-24 MED ORDER — BUPIVACAINE HCL (PF) 0.5 % IJ SOLN
INTRAMUSCULAR | Status: DC | PRN
  Administered 2017-11-24: 10 mL

## 2017-11-24 MED ORDER — MIDAZOLAM HCL 2 MG/2ML IJ SOLN
INTRAMUSCULAR | Status: AC
  Administered 2017-11-24: 2 mg via INTRAVENOUS
  Filled 2017-11-24: qty 4

## 2017-11-24 MED ORDER — ASPIRIN 325 MG PO TABS
325.0000 mg | ORAL_TABLET | Freq: Every day | ORAL | Status: DC
  Administered 2017-11-24 – 2017-11-25 (×2): 325 mg via ORAL
  Filled 2017-11-24 (×2): qty 1

## 2017-11-24 SURGICAL SUPPLY — 62 items
BANDAGE ACE 4X5 VEL STRL LF (GAUZE/BANDAGES/DRESSINGS) ×3 IMPLANT
BANDAGE ACE 6X5 VEL STRL LF (GAUZE/BANDAGES/DRESSINGS) ×3 IMPLANT
BANDAGE ESMARK 6X9 LF (GAUZE/BANDAGES/DRESSINGS) ×1 IMPLANT
BIT DRILL 2.5 X LONG (BIT) ×1
BIT DRILL LCP QC 2X140 (BIT) ×3 IMPLANT
BIT DRILL LONG 2.7 (BIT) ×1 IMPLANT
BIT DRILL X LONG 2.5 (BIT) ×1 IMPLANT
BNDG COHESIVE 4X5 TAN STRL (GAUZE/BANDAGES/DRESSINGS) ×3 IMPLANT
BNDG ESMARK 6X9 LF (GAUZE/BANDAGES/DRESSINGS) ×3
BONE CANC CHIPS 20CC PCAN1/4 (Bone Implant) ×3 IMPLANT
BRUSH SCRUB SURG 4.25 DISP (MISCELLANEOUS) ×6 IMPLANT
CHIPS CANC BONE 20CC PCAN1/4 (Bone Implant) ×1 IMPLANT
CHLORAPREP W/TINT 26ML (MISCELLANEOUS) ×3 IMPLANT
COVER SURGICAL LIGHT HANDLE (MISCELLANEOUS) ×3 IMPLANT
DRAPE C-ARM 42X72 X-RAY (DRAPES) ×3 IMPLANT
DRAPE C-ARMOR (DRAPES) ×3 IMPLANT
DRAPE ORTHO SPLIT 77X108 STRL (DRAPES) ×4
DRAPE SURG ORHT 6 SPLT 77X108 (DRAPES) ×2 IMPLANT
DRAPE U-SHAPE 47X51 STRL (DRAPES) ×3 IMPLANT
DRILL BIT LONG 2.7 (BIT) ×3
DRILL BIT X LONG 2.5 (BIT) ×2
DRSG ADAPTIC 3X8 NADH LF (GAUZE/BANDAGES/DRESSINGS) ×3 IMPLANT
ELECT REM PT RETURN 9FT ADLT (ELECTROSURGICAL) ×3
ELECTRODE REM PT RTRN 9FT ADLT (ELECTROSURGICAL) ×1 IMPLANT
GAUZE SPONGE 4X4 12PLY STRL (GAUZE/BANDAGES/DRESSINGS) ×3 IMPLANT
GLOVE BIO SURGEON STRL SZ7.5 (GLOVE) ×9 IMPLANT
GLOVE BIOGEL PI IND STRL 7.5 (GLOVE) ×1 IMPLANT
GLOVE BIOGEL PI INDICATOR 7.5 (GLOVE) ×2
GOWN STRL REUS W/ TWL LRG LVL3 (GOWN DISPOSABLE) ×2 IMPLANT
GOWN STRL REUS W/TWL LRG LVL3 (GOWN DISPOSABLE) ×4
KIT TURNOVER KIT B (KITS) ×3 IMPLANT
MANIFOLD NEPTUNE II (INSTRUMENTS) ×3 IMPLANT
NEEDLE 25GAX1.5 (MISCELLANEOUS) ×3 IMPLANT
NEEDLE HYPO 21X1.5 SAFETY (NEEDLE) IMPLANT
NS IRRIG 1000ML POUR BTL (IV SOLUTION) ×3 IMPLANT
PACK TOTAL JOINT (CUSTOM PROCEDURE TRAY) ×3 IMPLANT
PAD ARMBOARD 7.5X6 YLW CONV (MISCELLANEOUS) ×6 IMPLANT
PAD CAST 4YDX4 CTTN HI CHSV (CAST SUPPLIES) ×1 IMPLANT
PADDING CAST COTTON 4X4 STRL (CAST SUPPLIES) ×2
PADDING CAST COTTON 6X4 STRL (CAST SUPPLIES) ×3 IMPLANT
PLATE DIST TIBIA 6H 2.7/3.5MM (Plate) ×3 IMPLANT
SCREW CORTEX LOW PRO 3.5X32 (Screw) ×3 IMPLANT
SCREW CORTEX LP 3.5X34MM (Screw) ×6 IMPLANT
SCREW LOCKING VA 2.7X42 (Screw) ×3 IMPLANT
SCREW LOCKING VA 2.7X48 (Screw) ×6 IMPLANT
SCREW METAPHYSCAL 46MM (Screw) ×3 IMPLANT
SCREW THRD STARDRIVE ST 2.7X44 (Screw) ×3 IMPLANT
SPONGE LAP 18X18 X RAY DECT (DISPOSABLE) IMPLANT
STAPLER VISISTAT 35W (STAPLE) ×3 IMPLANT
SUCTION FRAZIER HANDLE 10FR (MISCELLANEOUS) ×2
SUCTION TUBE FRAZIER 10FR DISP (MISCELLANEOUS) ×1 IMPLANT
SUT ETHILON 3 0 PS 1 (SUTURE) ×9 IMPLANT
SUT PROLENE 0 CT (SUTURE) IMPLANT
SUT VIC AB 0 CT1 27 (SUTURE) ×2
SUT VIC AB 0 CT1 27XBRD ANBCTR (SUTURE) ×1 IMPLANT
SUT VIC AB 2-0 CT1 27 (SUTURE) ×4
SUT VIC AB 2-0 CT1 TAPERPNT 27 (SUTURE) ×2 IMPLANT
SYR CONTROL 10ML LL (SYRINGE) ×3 IMPLANT
TOWEL OR 17X24 6PK STRL BLUE (TOWEL DISPOSABLE) ×3 IMPLANT
TOWEL OR 17X26 10 PK STRL BLUE (TOWEL DISPOSABLE) ×6 IMPLANT
UNDERPAD 30X30 (UNDERPADS AND DIAPERS) ×3 IMPLANT
WATER STERILE IRR 1000ML POUR (IV SOLUTION) ×3 IMPLANT

## 2017-11-24 NOTE — Anesthesia Procedure Notes (Addendum)
Procedure Name: LMA Insertion Date/Time: 11/24/2017 10:42 AM Performed by: Fransisca KaufmannMeyer, Kasheena Sambrano E, CRNA Pre-anesthesia Checklist: Patient identified, Emergency Drugs available, Suction available and Patient being monitored Patient Re-evaluated:Patient Re-evaluated prior to induction Oxygen Delivery Method: Circle System Utilized Preoxygenation: Pre-oxygenation with 100% oxygen Induction Type: IV induction Ventilation: Mask ventilation without difficulty LMA: LMA inserted LMA Size: 4.0 Number of attempts: 1 Placement Confirmation: positive ETCO2 Tube secured with: Tape Dental Injury: Teeth and Oropharynx as per pre-operative assessment

## 2017-11-24 NOTE — Transfer of Care (Signed)
Immediate Anesthesia Transfer of Care Note  Patient: Steve Harmon  Procedure(s) Performed: OPEN REDUCTION INTERNAL FIXATION (ORIF) LEFT PILON FRACTURE (Left Leg Lower)  Patient Location: PACU  Anesthesia Type:General and Regional  Level of Consciousness: awake, alert , oriented and sedated  Airway & Oxygen Therapy: Patient Spontanous Breathing and Patient connected to nasal cannula oxygen  Post-op Assessment: Report given to RN, Post -op Vital signs reviewed and stable and Patient moving all extremities  Post vital signs: Reviewed and stable  Last Vitals:  Vitals Value Taken Time  BP 124/85 11/24/2017  1:16 PM  Temp    Pulse 74 11/24/2017  1:21 PM  Resp 16 11/24/2017  1:21 PM  SpO2 100 % 11/24/2017  1:21 PM  Vitals shown include unvalidated device data.  Last Pain:  Vitals:   11/24/17 0757  TempSrc:   PainSc: 4          Complications: No apparent anesthesia complications

## 2017-11-24 NOTE — Anesthesia Postprocedure Evaluation (Signed)
Anesthesia Post Note  Patient: Steve Harmon  Procedure(s) Performed: OPEN REDUCTION INTERNAL FIXATION (ORIF) LEFT PILON FRACTURE (Left Leg Lower)     Patient location during evaluation: PACU Anesthesia Type: Regional and General Level of consciousness: awake and alert Pain management: pain level controlled Vital Signs Assessment: post-procedure vital signs reviewed and stable Respiratory status: spontaneous breathing, nonlabored ventilation and respiratory function stable Cardiovascular status: blood pressure returned to baseline and stable Postop Assessment: no apparent nausea or vomiting Anesthetic complications: no    Last Vitals:  Vitals:   11/24/17 1000 11/24/17 1316  BP:  124/85  Pulse: (!) 59 66  Resp: 17 17  Temp:  (!) 36.3 C  SpO2: 99% 100%    Last Pain:  Vitals:   11/24/17 1316  TempSrc:   PainSc: 0-No pain                 Beckam Abdulaziz,W. EDMOND

## 2017-11-24 NOTE — Anesthesia Procedure Notes (Signed)
Anesthesia Regional Block: Popliteal block   Pre-Anesthetic Checklist: ,, timeout performed, Correct Patient, Correct Site, Correct Laterality, Correct Procedure, Correct Position, site marked, Risks and benefits discussed, pre-op evaluation,  At surgeon's request and post-op pain management  Laterality: Left  Prep: Maximum Sterile Barrier Precautions used, chloraprep       Needles:  Injection technique: Single-shot  Needle Type: Echogenic Stimulator Needle     Needle Length: 9cm  Needle Gauge: 21     Additional Needles:   Procedures:,,,, ultrasound used (permanent image in chart),,,,  Narrative:  Start time: 11/24/2017 9:39 AM End time: 11/24/2017 9:49 AM Injection made incrementally with aspirations every 5 mL. Anesthesiologist: Gaynelle AduFitzgerald, Tata Timmins, MD  Additional Notes: 2% Lidocaine skin wheel. Saphenous block with 10cc of 0.5% Bupivicaine plain.

## 2017-11-24 NOTE — Op Note (Signed)
OrthopaedicSurgeryOperativeNote [phone]) Date of Surgery: 11/24/2017  Admit Date: 11/24/2017   Diagnoses: Pre-Op Diagnoses: Closed left pilon fracture  Post-Op Diagnosis: Same  Procedures: CPT 27827-Open reduction internal fixation of left pilon fracture  Surgeons: Primary: Roby Lofts, MD   Location:[facility] OR ROOM 07   AnesthesiaGeneral   Antibiotics:Ancef 2g preop   Tourniquettime: Total Tourniquet Time Documented: Thigh (Left) - 104 minutes Total: Thigh (Left) - 104 minutes  EstimatedBloodLoss:Minimal  Complications:None  Specimens:None  Implants: Implant Name Type Inv. Item Serial No. Manufacturer Lot No. LRB No. Used Action  BONE CANC CHIPS 20CC - [id number] Bone Implant BONE [facility] CHIPS 20CC [id number] [employer]  Left 1 Implanted  PLATE DIST TIBIA 6H 2.7/3.5MM - [id number] Plate PLATE DIST TIBIA 6H 2.7/3.5MM  [employer] TRAUMA  Left 1 Implanted  SCREW THRD STARDRIVE ST 2.7X44 - [id number] Screw SCREW THRD STARDRIVE ST 2.7X44  SYNTHES MAXILLOFACIAL  Left 1 Implanted  SCREW CORTEX LOW PRO 3.5X32 - [id number] Screw SCREW CORTEX LOW PRO 3.5X32  [employer] TRAUMA  Left 1 Implanted  SCREW CORTEX LP 3.5X34MM - [id number] Screw SCREW CORTEX LP 3.5X34MM  [employer] TRAUMA  Left 2 Implanted  SCREW METAPHYSCAL - [id number] Screw SCREW METAPHYSCAL  [employer]UMA  Left 1 Implanted  SCREW LOCKING VA 2.7X42 - [id number] Screw SCREW LOCKING VA 2.7X42  [employer]UMA  Left 1 Implanted  SCREW LOCKING VA 2.7X48 - [id number] Screw SCREW LOCKING VA 2.7X48  [employer] TRAUMA  Left 2 Implanted    IndicationsforSurgery: 42 year old male who fell from a height landing on his left lower extremity.  Had a left tibial pilon fracture with anterior impaction and subluxation.  Felt that proceeding with open reduction internal fixation would be most appropriate.  Risks and benefits were discussed with the patient. Risks discussed included bleeding requiring  blood transfusion, bleeding causing a hematoma, infection, malunion, nonunion, damage to surrounding nerves and blood vessels, pain, hardware prominence or irritation, hardware failure, stiffness, post-traumatic arthritis, DVT/PE, compartment syndrome, and even death. The patient agreed to proceed with surgery and consent was obtained.  Operative Findings: Anterior impaction pilon fracture treated with open reduction internal fixation using disimpaction with bone grafting and plating using Synthes 6-hole anterior lateral plate  Procedure: The patient was identified in the preoperative holding area. Consent was confirmed with the patient and their family and all questions were answered. The operative extremity was marked after confirmation with the patient. . Patient was then brought back to the operating room by our anesthesia colleagues. The patient was transferred to a radiolucent flat top table.  They were placed under general anesthetic. An upper thigh tourniquet was placed. The operative extremity was then prepped and draped in usual sterile fashion. A preoperative timeout was performed to verify the patient, the procedure, and the extremity. Preoperative antibiotics were dosed.  Fluoroscopic images were used to confirm the displacement and unstable nature of the fracture.  I then marked out an anteromedial incision.  The leg was exsanguinated with an Esmarch and the tourniquet was inflated to 300 mmHg.  Total tourniquet time is noted above.  I carefully incised through the skin and subcutaneous tissue taking care not to elevate skin flaps.  I carried this straight down to the periosteum.  Just medial to the anterior tibialis tendon, I incised through the periosteum and continued this all the way down to the joint itself.  I made a capsulotomy vertical in line with my periosteal incision.  I took care not to devitalized the anterior lateral  fragment.  At this point I opened up the fracture plane the  used irrigation to clean out the previous hematoma and bony fragments.  I was able to visualize the central impaction.  Using a Cobb elevator I proceeded to disimpact the articular surface.  I visualize the articular surface along with fluoroscopic imaging I was able to get a near anatomic reduction.  This was provisionally held in place with a 1.6 mm K wire.  I then backfilled the defect with a crush cancellus allograft to provide some structural support to the disimpacted articular segment.  The anterior lateral fragment was then reduced anatomically to the articular surface and fluoroscopic images were used to confirm anatomic reduction.  A clamp was used to hold this in place and provisional fixation was provided by a 1.6 mm K wire.  A 2.7 mm lag screw was then used to compress the anterior lateral fragment reduction in place while a anterior lateral plate was chosen.  6 hole Synthes distal tibial locking plate was contoured to appropriately fit the tibia.  2.7 mm metaphyseal screw was placed in the distal segment and a 3.5 millimeter screw was placed into the metaphysis to align the distal portion of the plate.  3 locking screws were then placed in the distal articular segment to raft the articular surface.  2 more nonlocking screws were placed through percutaneous incisions.  This brought the plate flush to bone.  The incisions were then copiously irrigated.  I placed 1 g of vancomycin powder.  I closed the periosteum with #1 Vicryl suture.  The skin was closed with 2-0 Vicryl and 3-0 nylon suture.  The remainder of the percutaneous incisions were closed with 3-0 nylon suture.  A sterile dressing consisting of bacitracin ointment, Adaptic, 4 x 4's and sterile cast padding was placed.  A well-padded short leg splint was then applied.  The patient was awoken from anesthesia and taken to the PACU in stable condition.  Post Op Plan/Instructions: The patient will be nonweightbearing to left lower  extremity.  We will admit him for observation.  He will receive aspirin for DVT prophylaxis.  He will return in 2 weeks for x-rays and suture.  I was present and performed the entire surgery.  Truitt MerleKevin Irbin Fines, MD [facility]

## 2017-11-24 NOTE — Interval H&P Note (Signed)
History and Physical Interval Note:  11/24/2017 8:17 AM  Steve Harmon  has presented today for surgery, with the diagnosis of Left pilon fracture  The various methods of treatment have been discussed with the patient and family. After consideration of risks, benefits and other options for treatment, the patient has consented to  Procedure(s): OPEN REDUCTION INTERNAL FIXATION (ORIF) LEFT PILON FRACTURE (Left) as a surgical intervention .  The patient's history has been reviewed, patient examined, no change in status, stable for surgery.  I have reviewed the patient's chart and labs.  Questions were answered to the patient's satisfaction.     Caryn BeeKevin P Skeeter Sheard

## 2017-11-25 ENCOUNTER — Encounter (HOSPITAL_COMMUNITY): Payer: Self-pay | Admitting: General Practice

## 2017-11-25 DIAGNOSIS — S82872A Displaced pilon fracture of left tibia, initial encounter for closed fracture: Secondary | ICD-10-CM | POA: Diagnosis not present

## 2017-11-25 MED ORDER — METHOCARBAMOL 750 MG PO TABS: 750.0000 mg | ORAL_TABLET | Freq: Four times a day (QID) | ORAL | 1 refills | Status: DC | PRN

## 2017-11-25 MED ORDER — OXYCODONE-ACETAMINOPHEN 5-325 MG PO TABS
1.0000 | ORAL_TABLET | Freq: Once | ORAL | Status: AC
  Administered 2017-11-25: 1 via ORAL
  Filled 2017-11-25: qty 1

## 2017-11-25 MED ORDER — OXYCODONE-ACETAMINOPHEN 5-325 MG PO TABS: 1.0000 | ORAL_TABLET | ORAL | 0 refills | Status: DC | PRN

## 2017-11-25 MED ORDER — ASPIRIN 325 MG PO TABS: 325.0000 mg | ORAL_TABLET | Freq: Every day | ORAL | 0 refills | Status: DC

## 2017-11-25 MED FILL — OXYCODONE-ACETAMINOPHEN 5-3: 5-325 | 3 days supply | Qty: 30 | Fill #0

## 2017-11-25 MED FILL — METHOCARBAMOL 750 MG TABS: 750 | 7 days supply | Qty: 25 | Fill #0

## 2017-11-25 MED FILL — ASPIRIN EC 325 MG TABLET: 325 | 42 days supply | Qty: 42 | Fill #0

## 2017-11-25 NOTE — Discharge Summary (Signed)
Orthopaedic Trauma Service (OTS)  Patient ID: Steve Harmon MRN: [id number] DOB/AGE: 41/11/1975 41 y.o.  Admit date: 11/24/2017 Discharge date: 11/25/2017  Admission Diagnoses:Closed left pilon fracture, initial encounter  Discharge Diagnoses:  Principal Problem:   Closed left pilon fracture, initial encounter   Past Medical History:  Diagnosis Date  . Anxiety    "needles"  . Asthma    as a child     Procedures Performed: 11/24/2017:CPT 27827-Open reduction internal fixation of left pilon fracture  Discharged Condition: good  Hospital Course: Patient admitted overnight for observation and pain control. He was discharge home postoperative day 1  Consults: None  Significant Diagnostic Studies: None  Treatments: surgery: as above  Discharge Exam:  NAD, AAOx3 LLE: Unable to wiggle toes due to block but endorses sensation to foot. Warm and well perfused foot.  Disposition: Discharge disposition: 01-Home or Self Care        Allergies as of 11/25/2017      Reactions   Chlorpheniramine Hives      Medication List    STOP taking these medications   HYDROcodone-acetaminophen 5-325 MG tablet Commonly known as:  NORCO/VICODIN   ibuprofen 800 MG tablet Commonly known as:  ADVIL,MOTRIN     TAKE these medications   aspirin 325 MG tablet Take 1 tablet (325 mg total) by mouth daily. Start taking on:  11/26/2017   methocarbamol 750 MG tablet Commonly known as:  ROBAXIN Take 1 tablet (750 mg total) by mouth every 6 (six) hours as needed for muscle spasms. What changed:    medication strength  how much to take  when to take this  reasons to take this   oxyCODONE-acetaminophen 5-325 MG tablet Commonly known as:  PERCOCET/ROXICET Take 1-2 tablets by mouth every 4 (four) hours as needed for moderate pain.      Follow-up Information    Milus Fritze, Gillie MannersKevin P, MD. Schedule an appointment as soon as possible for a visit in 2 week(s).   Specialty:  Orthopedic  Surgery Contact information: [address] [place] [address] [phone]           Discharge Instructions and Plan: Nonweightbearing left leg. Return in 2 weeks for suture removal and x-rays. Aspirin for DVT prophylaxis.  Signed:  Roby LoftsKevin P. Haide Klinker, MD [facility] 11/25/2017, 8:42 AM

## 2017-11-25 NOTE — Progress Notes (Signed)
Orthopedic Tech Progress Note Patient Details:  Steve Harmon 06/01/1975 [id number]  Ortho Devices Type of Ortho Device: Crutches Ortho Device/Splint Interventions: Application   Post Interventions Patient Tolerated: Well Instructions Provided: Care of device   Nikki DomCrawford, Steve Harmon 11/25/2017, 12:12 PM

## 2017-11-25 NOTE — Progress Notes (Signed)
Discharge instructions completed with pt.  Pt verbalized understanding of the information.  Pt denies chest pain, shortness of breath, dizziness, lightheadedness, and n/v.  Pt's IV discontinued.  Pt waiting for ride to be transported home.  

## 2017-11-25 NOTE — Evaluation (Signed)
Physical Therapy Evaluation Patient Details Name: Steve Harmon MRN: [id number] DOB: 02/10/1976 Today's Date: 11/25/2017   History of Present Illness  Pt is a 42 y/o male s/p L pilon fx ORIF. PMH includes smoker and asthma.   Clinical Impression  Pt is s/p surgery above with deficits below. Pt limited secondary to pain. Requiring supervision for ambulation using RW and then with crutches. Pt reports increased comfort with use of crutches. [facility] longer distances, especially given pt's limited ambulation tolerance. Will continue to follow acutely to maximize functional mobility independence and safety.     Follow Up Recommendations No PT follow up    Equipment Recommendations  Crutches;Wheelchair (measurements PT)    Recommendations for Other Services       Precautions / Restrictions Precautions Precautions: None Restrictions Weight Bearing Restrictions: Yes LLE Weight Bearing: Non weight bearing      Mobility  Bed Mobility Overal bed mobility: Modified Independent                Transfers Overall transfer level: Needs assistance Equipment used: Rolling walker (2 wheeled);Crutches Transfers: Sit to/from Stand Sit to Stand: Supervision         General transfer comment: Supervision for safety. Verbal cues for safe hand placement.   Ambulation/Gait Ambulation/Gait assistance: Supervision Gait Distance (Feet): 20 Feet Assistive device: Rolling walker (2 wheeled);Crutches Gait Pattern/deviations: Step-to pattern Gait velocity: Decreased    General Gait Details: Hop to pattern using RW and then using crutches. Pt reports more comfort with crutches, but only able to tolerate short distance within the room secondary to pain. Educated about use of WC for longer distances.   Stairs Stairs: Yes Stairs assistance: Supervision;Min guard Stair Management: Step to pattern;Forwards;With crutches Number of Stairs: 2 General stair comments:  Cautious stair navigation, however, overall steady. Verbal cues for sequencing using crutches.   Wheelchair Mobility    Modified Rankin (Stroke Patients Only)       Balance Overall balance assessment: Needs assistance Sitting-balance support: No upper extremity supported;Feet supported Sitting balance-Leahy Scale: Good     Standing balance support: Bilateral upper extremity supported;During functional activity Standing balance-Leahy Scale: Poor Standing balance comment: Reliant on BUE support                              Pertinent Vitals/Pain Pain Assessment: 0-10 Pain Score: 5  Pain Location: LLE  Pain Descriptors / Indicators: Aching;Operative site guarding Pain Intervention(s): Limited activity within patient's tolerance;Monitored during session;Repositioned    Home Living Family/patient expects to be discharged to:: Private residence Living Arrangements: Parent Available Help at Discharge: Family;Available 24 hours/day Type of Home: Mobile home Home Access: Stairs to enter Entrance Stairs-Rails: Left Entrance Stairs-Number of Steps: 6 Home Layout: One level Home Equipment: Crutches;Toilet riser      Prior Function Level of Independence: Independent with assistive device(s)         Comments: Using crutches      Hand Dominance        Extremity/Trunk Assessment   Upper Extremity Assessment Upper Extremity Assessment: Overall WFL for tasks assessed    Lower Extremity Assessment Lower Extremity Assessment: LLE deficits/detail LLE Deficits / Details: LLE immobilized following surgery at ankle.     Cervical / Trunk Assessment Cervical / Trunk Assessment: Normal  Communication   Communication: No difficulties  Cognition Arousal/Alertness: Awake/alert Behavior During Therapy: WFL for tasks assessed/performed Overall Cognitive Status: Within Functional  Limits for tasks assessed                                         General Comments General comments (skin integrity, edema, etc.): Verbal education about how to safely transfer to [facility] and importance of locking brakes.     Exercises     Assessment/Plan    PT Assessment Patient needs continued PT services  PT Problem List Decreased activity tolerance;Decreased mobility;Decreased knowledge of use of DME;Pain       PT Treatment Interventions DME instruction;Functional mobility training;Stair training;Gait training;Therapeutic activities;Balance training;Therapeutic exercise;Patient/family education    PT Goals (Current goals can be found in the Care Plan section)  Acute Rehab PT Goals Patient Stated Goal: to go home today  PT Goal Formulation: With patient Time For Goal Achievement: 12/09/17 Potential to Achieve Goals: Good    Frequency Min 5X/week   Barriers to discharge        Co-evaluation               AM-PAC PT "6 Clicks" Daily Activity  Outcome Measure Difficulty turning over in bed (including adjusting bedclothes, sheets and blankets)?: None Difficulty moving from lying on back to sitting on the side of the bed? : A Little Difficulty sitting down on and standing up from a chair with arms (e.g., wheelchair, bedside commode, etc,.)?: Unable Help needed moving to and from a bed to chair (including a wheelchair)?: A Little Help needed walking in hospital room?: A Little Help needed climbing 3-5 steps with a railing? : A Little 6 Click Score: 17    End of Session Equipment Utilized During Treatment: Gait belt Activity Tolerance: Patient limited by pain Patient left: in bed;with call bell/phone within reach;with family/visitor present Nurse Communication: Mobility status PT Visit Diagnosis: Other abnormalities of gait and mobility (R26.89);Pain Pain - Right/Left: Left Pain - part of body: Ankle and joints of foot    Time: 1610-9604 PT Time Calculation (min) (ACUTE ONLY): 23 min   Charges:   PT Evaluation $PT Eval Low  Complexity: 1 Low PT Treatments $Gait Training: 8-22 mins        Gladys Damme, PT, DPT  Acute Rehabilitation Services  Pager: [phone] Office: [phone]   Lehman Prom 11/25/2017, 1:25 PM

## 2017-11-25 NOTE — Care Management Note (Signed)
Case Management Note  Patient Details  Name: Steve Harmon MRN: [id number] Date of Birth: 02/22/1976  Subjective/Objective:   ORIF left pilon fracture                 Action/Plan: NCM spoke to patient and wife at bedside. Pt is requesting a light weight manual wheelchair. Contacted [facility] for light weight manual wheelchair for home. Contacted ortho tech for crutches for home.   Expected Discharge Date:  11/25/17               Expected Discharge Plan:  Home/Self Care  In-House Referral:  NA  Discharge planning Services  CM Consult  Post Acute Care Choice:  NA Choice offered to:  NA  DME Arranged:  Lightweight manual wheelchair with seat cushion, Crutches DME Agency:  [facility]  HH Arranged:  NA HH Agency:  NA  Status of Service:  Completed, signed off  If discussed at Long Length of Stay Meetings, dates discussed:    Additional Comments:  [name], [name], RN 11/25/2017, 12:20 PM

## 2017-11-25 NOTE — Progress Notes (Addendum)
Patient suffers from L pilon fx s/p ORIF; NWB on LLE which impairs their ability to perform daily activities like ambulation/ADLs in the home/communicate.  A walker alone will not resolve the issues with performing activities of daily living. A lightweight manual wheelchair with elevating leg rests will allow patient to safely perform daily activities.  The patient can self propel in the home or has a caregiver who can provide assistance.      Gladys DammeBrittany Makhi Muzquiz, PT, DPT  Acute Rehabilitation Services  Pager: [phone] Office: [phone]

## 2017-11-25 NOTE — Discharge Instructions (Signed)
Orthopaedic Trauma Service Discharge Instructions   General Discharge Instructions  WEIGHT BEARING STATUS:No weightbearing on your leg  RANGE OF MOTION/ACTIVITY: No restrictions in hip and knee range of motion  Wound Care: Do not remove the splint from your leg. Keep it clean and dry. Do not get it wet  DVT/PE prophylaxis: Take a daily aspirin to prevent blood clots  Diet: as you were eating previously.  Can use over the counter stool softeners and bowel preparations, such as Miralax, to help with bowel movements.  Narcotics can be constipating.  Be sure to drink plenty of fluids  PAIN MEDICATION USE AND EXPECTATIONS  You have likely been given narcotic medications to help control your pain.  After a traumatic event that results in an fracture (broken bone) with or without surgery, it is ok to use narcotic pain medications to help control one's pain.  We understand that everyone responds to pain differently and each individual patient will be evaluated on a regular basis for the continued need for narcotic medications. Ideally, narcotic medication use should last no more than 6-8 weeks (coinciding with fracture healing).   As a patient it is your responsibility as well to monitor narcotic medication use and report the amount and frequency you use these medications when you come to your office visit.   We would also advise that if you are using narcotic medications, you should take a dose prior to therapy to maximize you participation.  IF YOU ARE ON NARCOTIC MEDICATIONS IT IS NOT PERMISSIBLE TO OPERATE A MOTOR VEHICLE (MOTORCYCLE/CAR/TRUCK/MOPED) OR HEAVY MACHINERY DO NOT MIX NARCOTICS WITH OTHER CNS (CENTRAL NERVOUS SYSTEM) DEPRESSANTS SUCH AS ALCOHOL   STOP SMOKING OR USING NICOTINE PRODUCTS!!!!  As discussed nicotine severely impairs your body's ability to heal surgical and traumatic wounds but also impairs bone healing.  Wounds and bone heal by forming microscopic blood vessels  (angiogenesis) and nicotine is a vasoconstrictor (essentially, shrinks blood vessels).  Therefore, if vasoconstriction occurs to these microscopic blood vessels they essentially disappear and are unable to deliver necessary nutrients to the healing tissue.  This is one modifiable factor that you can do to dramatically increase your chances of healing your injury.    (This means no smoking, no nicotine gum, patches, etc)  DO NOT USE NONSTEROIDAL ANTI-INFLAMMATORY DRUGS (NSAID'S)  Using products such as Advil (ibuprofen), Aleve (naproxen), Motrin (ibuprofen) for additional pain control during fracture healing can delay and/or prevent the healing response.  If you would like to take over the counter (OTC) medication, Tylenol (acetaminophen) is ok.  However, some narcotic medications that are given for pain control contain acetaminophen as well. Therefore, you should not exceed more than 4000 mg of tylenol in a day if you do not have liver disease.  Also note that there are may OTC medicines, such as cold medicines and allergy medicines that my contain tylenol as well.  If you have any questions about medications and/or interactions please ask your doctor/PA or your pharmacist.      ICE AND ELEVATE INJURED/OPERATIVE EXTREMITY  Using ice and elevating the injured extremity above your heart can help with swelling and pain control.  Icing in a pulsatile fashion, such as 20 minutes on and 20 minutes off, can be followed.    Do not place ice directly on skin. Make sure there is a barrier between to skin and the ice pack.    Using frozen items such as frozen peas works well as the conform nicely to the are  that needs to be iced.  USE AN ACE WRAP OR TED HOSE FOR SWELLING CONTROL  In addition to icing and elevation, Ace wraps or TED hose are used to help limit and resolve swelling.  It is recommended to use Ace wraps or TED hose until you are informed to stop.    When using Ace Wraps start the wrapping distally  (farthest away from the body) and wrap proximally (closer to the body)   Example: If you had surgery on your leg or thing and you do not have a splint on, start the ace wrap at the toes and work your way up to the thigh        If you had surgery on your upper extremity and do not have a splint on, start the ace wrap at your fingers and work your way up to the upper arm  IF YOU ARE IN A SPLINT OR CAST DO NOT REMOVE IT FOR ANY REASON   If your splint gets wet for any reason please contact the office immediately. You may shower in your splint or cast as long as you keep it dry.  This can be done by wrapping in a cast cover or garbage back (or similar)  Do Not stick any thing down your splint or cast such as pencils, money, or hangers to try and scratch yourself with.  If you feel itchy take benadryl as prescribed on the bottle for itching  IF YOU ARE IN A CAM BOOT (BLACK BOOT)  You may remove boot periodically. Perform daily dressing changes as noted below.  Wash the liner of the boot regularly and wear a sock when wearing the boot. It is recommended that you sleep in the boot until told otherwise  CALL THE OFFICE WITH ANY QUESTIONS OR CONCERNS: [phone]

## 2019-05-05 IMAGING — CT CT ANKLE*L* W/O CM
5 series · 16 of 33 positions shown, 18 images · non-contrast
Comparison: Radiographs dated 11/13/2017

CLINICAL DATA: Distal left tibia fracture.

EXAM:
CT OF THE LEFT ANKLE WITHOUT CONTRAST
TECHNIQUE: Multidetector CT imaging of the left ankle was performed according
to the standard protocol. Multiplanar CT image reconstructions were
also generated.

[Series 9: axial st · axial · 0.38mm/px · z∈[+378,+466]mm · 2 of 134 slices shown]
[im 45/134  bone]
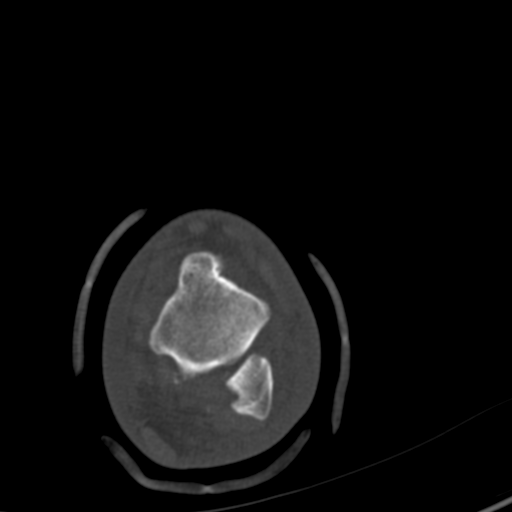
[im 89/134  bone]
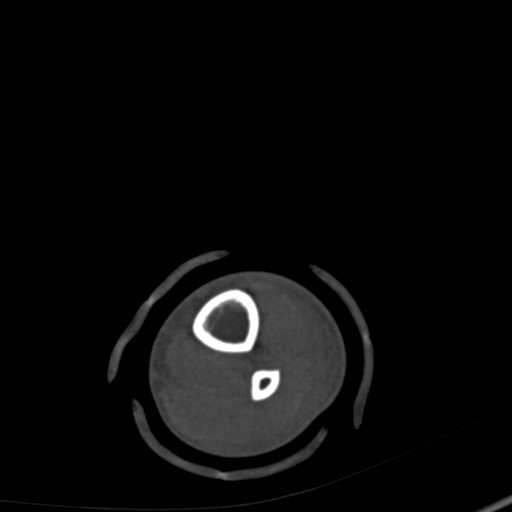

[Series 602: axial ankle · axial · 0.52mm/px · z∈[+356,+488]mm · 3 of 134 slices shown, 4 images]
[im 34/134  soft-tissue]
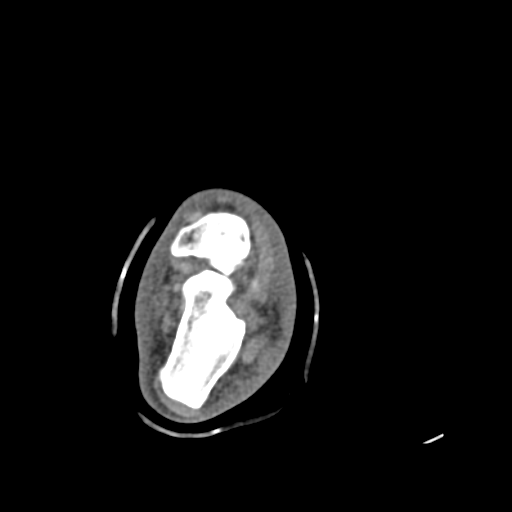
[im 34/134  bone]
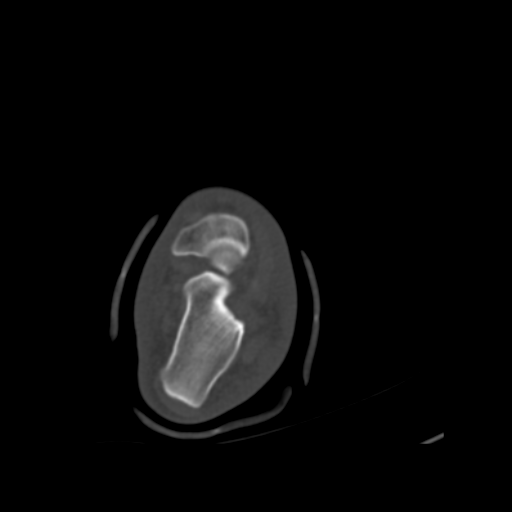
[im 67/134  bone]
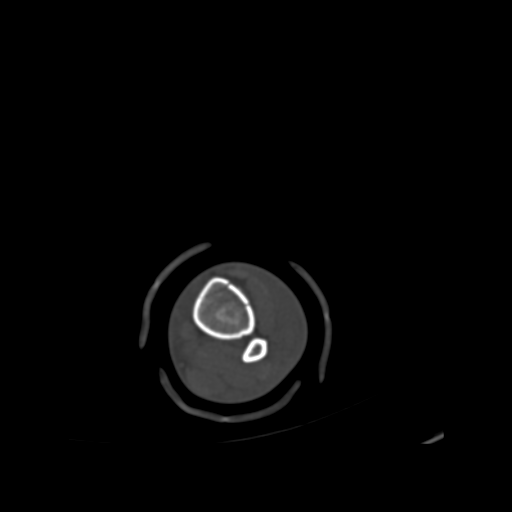
[im 100/134  bone]
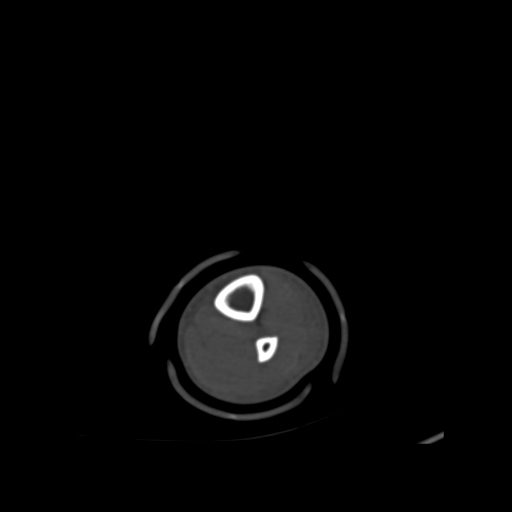

[Series 603: coronal st · coronal · 0.52mm/px · 3 of 82 slices shown]
[im 17/82  bone]
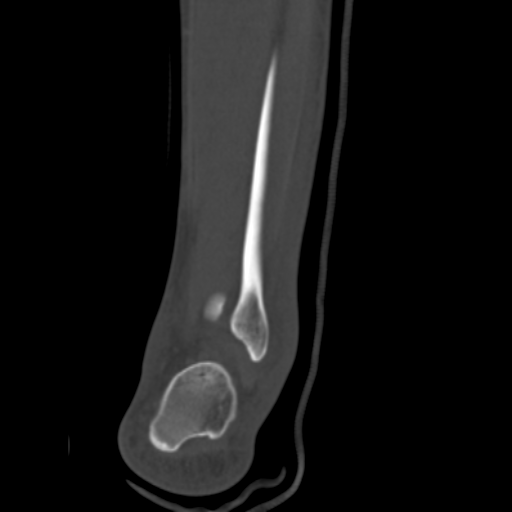
[im 33/82  bone]
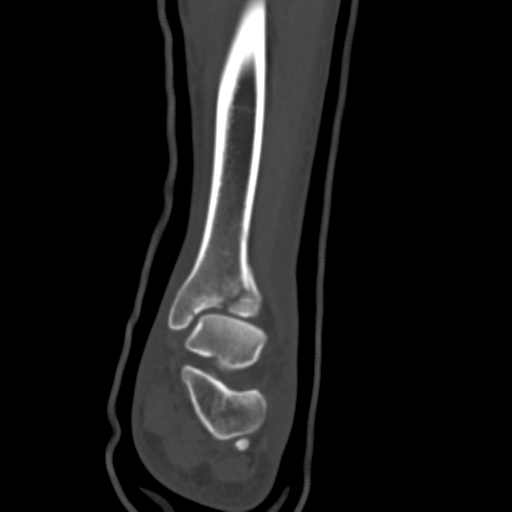
[im 49/82  bone]
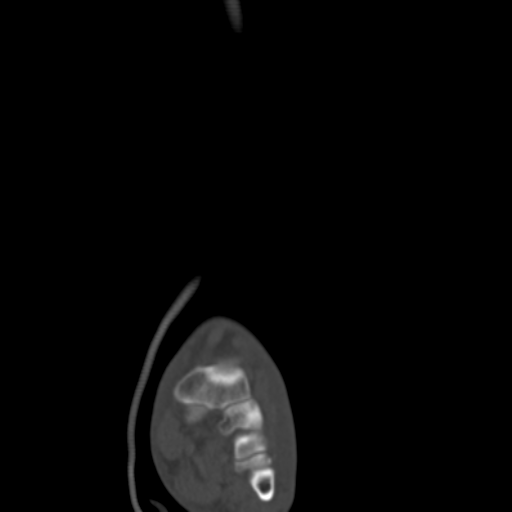

[Series 604: sagittal st · sagittal · 0.52mm/px · 5 of 57 slices shown, 6 images]
[im 19/57  bone]
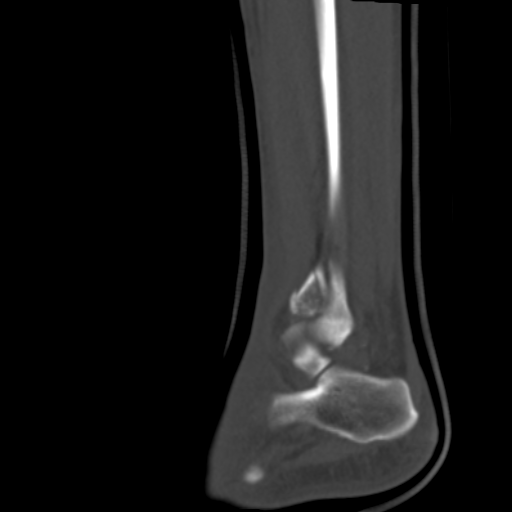
[im 24/57  bone]
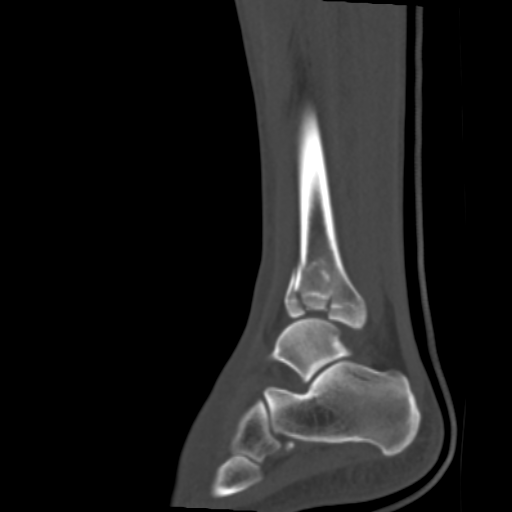
[im 29/57  soft-tissue]
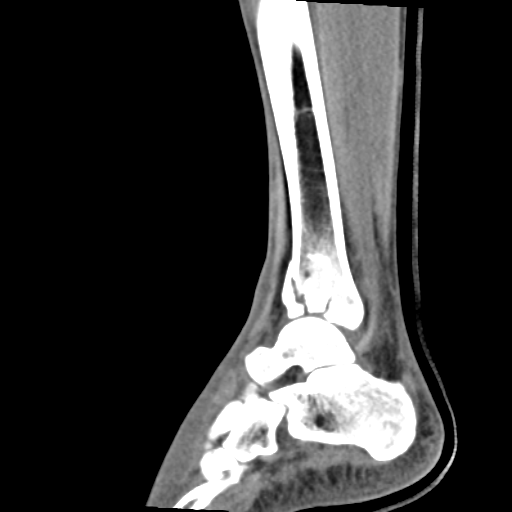
[im 29/57  bone]
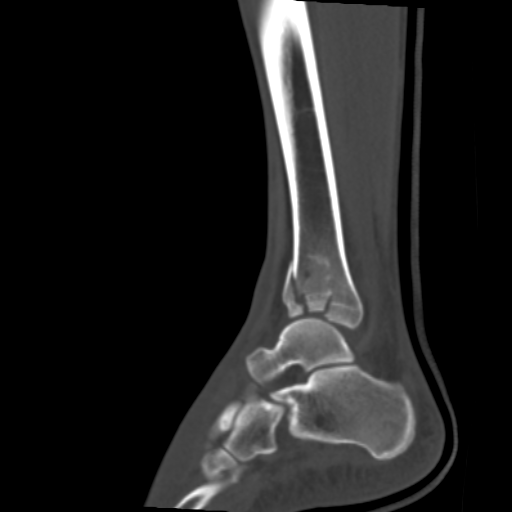
[im 33/57  bone]
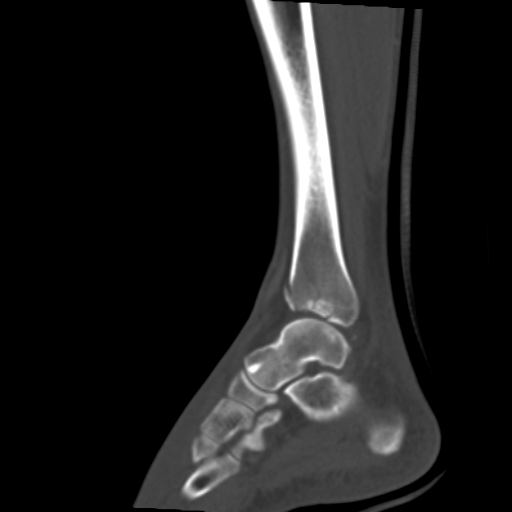
[im 38/57  bone]
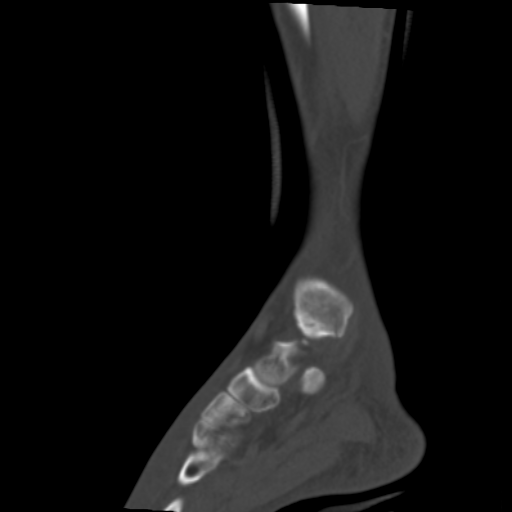

[Series 606: axial bone · axial · 0.52mm/px · z∈[+365,+496]mm · 3 of 130 slices shown]
[im 33/130  bone]
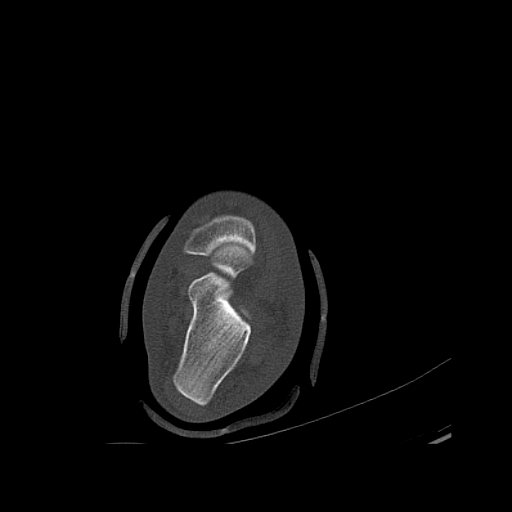
[im 65/130  bone]
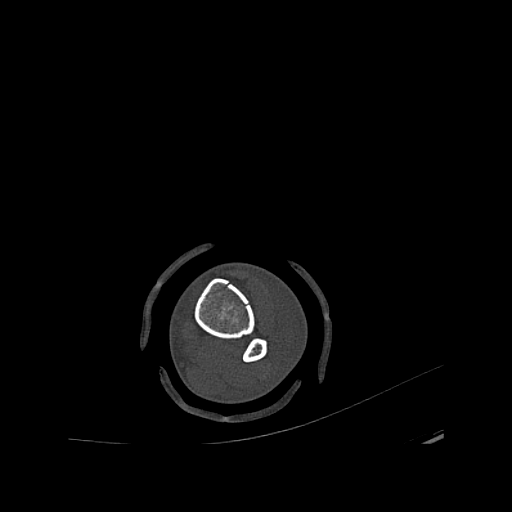
[im 97/130  bone]
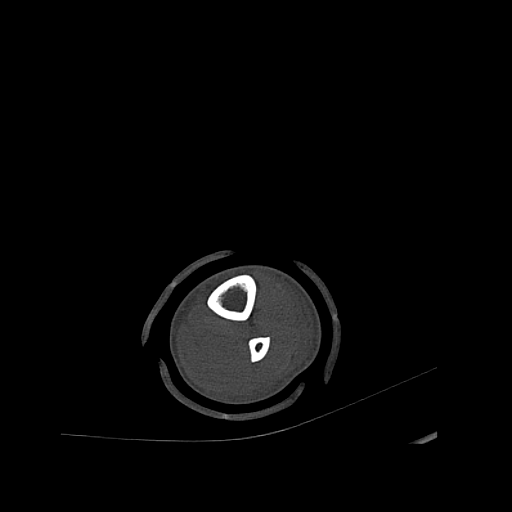

[16 of 33 positions shown; findings below may reference images not displayed]

FINDINGS: Bones/Joint/Cartilage

There is a comminuted Lagier fracture of the distal tibia. There is
approximately 3 mm of impaction of the central fragment of the
articular surface of the distal tibia. There is also extensive
comminution of the remainder of the articular surface of the distal
tibia. The fracture extends approximately 7 cm proximal to the
articular surface along the anterior cortex.

There are small avulsions of bone at the distal talofibular joint
best seen on images 91 and 93 of series 8. There are multiple tiny
bone fragments in the posterior aspect of the tibiotalar joint.

The talus and calcaneus and other visualized bones of the foot are
intact.

The fracture lines extend into the base of the medial malleolus.

Muscles and Tendons

No discrete abnormality.

Soft tissues

Circumferential soft tissue edema and/or hemorrhage around the ankle
and extending onto the dorsum of the foot.
IMPRESSION: Comminuted impacted fracture articular surface of the distal tibia.
The fracture extends into the medial malleolar base and into the
distal tibiofibular joint. No dislocation.

Tiny avulsions of bone at the talofibular joint. Distal fibula
otherwise appears intact.

## 2020-09-07 ENCOUNTER — Other Ambulatory Visit: Payer: Self-pay

## 2020-09-07 ENCOUNTER — Encounter (HOSPITAL_COMMUNITY): Payer: Self-pay | Admitting: Emergency Medicine

## 2020-09-07 ENCOUNTER — Emergency Department (HOSPITAL_COMMUNITY)
Admission: EM | Admit: 2020-09-07 | Discharge: 2020-09-07 | Disposition: A | Discharge status: Home / Self Care | Payer: BLUE CROSS/BLUE SHIELD | Attending: Emergency Medicine | Admitting: Emergency Medicine

## 2020-09-07 DIAGNOSIS — F17210 Nicotine dependence, cigarettes, uncomplicated: Secondary | ICD-10-CM | POA: Insufficient documentation

## 2020-09-07 DIAGNOSIS — J45909 Unspecified asthma, uncomplicated: Secondary | ICD-10-CM | POA: Insufficient documentation

## 2020-09-07 DIAGNOSIS — M545 Low back pain, unspecified: Secondary | ICD-10-CM

## 2020-09-07 DIAGNOSIS — K047 Periapical abscess without sinus: Secondary | ICD-10-CM | POA: Insufficient documentation

## 2020-09-07 MED ORDER — KETOROLAC TROMETHAMINE 10 MG PO TABS: 10.0000 mg | ORAL_TABLET | Freq: Four times a day (QID) | ORAL | 0 refills | Status: AC | PRN

## 2020-09-07 MED ORDER — AMOXICILLIN 500 MG PO CAPS: 500.0000 mg | ORAL_CAPSULE | Freq: Three times a day (TID) | ORAL | 0 refills | Status: AC

## 2020-09-07 MED ORDER — AMOXICILLIN 500 MG PO CAPS
500.0000 mg | ORAL_CAPSULE | Freq: Once | ORAL | Status: AC
  Administered 2020-09-07: 500 mg via ORAL
  Filled 2020-09-07: qty 1

## 2020-09-07 MED ORDER — KETOROLAC TROMETHAMINE 10 MG PO TABS
10.0000 mg | ORAL_TABLET | Freq: Once | ORAL | Status: AC
  Administered 2020-09-07: 10 mg via ORAL
  Filled 2020-09-07: qty 1

## 2020-09-07 NOTE — ED Notes (Signed)
RN reviewed discharge instructions w/ pt. Follow up, prescriptions, and pain management reviewed. Pt had no further questions 

## 2020-09-07 NOTE — ED Provider Notes (Signed)
[facility] EMERGENCY DEPARTMENT Provider Note   CSN: [id number] Arrival date & time: 09/07/20  0900     History Chief Complaint  Patient presents with   Back Pain   Dental Pain    Steve Harmon is a 45 y.o. male.  The history is provided by the patient.  Back Pain Location:  Lumbar spine (all across his low back) Quality:  Shooting Radiates to:  Does not radiate Pain severity:  Moderate Pain is:  Same all the time Onset quality:  Sudden Duration:  3 days Timing:  Constant Progression:  Unchanged Chronicity:  New Context comment:  Works on an [employer]. Does a lot of bending, lifting, and twisting. Relieved by:  Nothing Worsened by:  Movement (extension is worse than flexion) Ineffective treatments:  Heating pad (topical muscle rubs and massage) Associated symptoms: no abdominal pain, no bladder incontinence, no bowel incontinence, no chest pain, no dysuria, no fever, no leg pain, no numbness, no paresthesias, no tingling, no weakness and no weight loss   Dental Pain Location:  Upper Upper teeth location:  10/LU lateral incisor Quality:  Throbbing Severity:  Severe Onset quality:  Gradual Duration:  3 days Timing:  Constant Progression:  Worsening Chronicity:  New Context: poor dentition   Relieved by:  Nothing Worsened by:  Touching and pressure Ineffective treatments: white nail polish. Associated symptoms: no congestion, no difficulty swallowing, no drooling, no facial pain, no facial swelling, no fever, no neck pain, no neck swelling and no oral bleeding       Past Medical History:  Diagnosis Date   Anxiety    "needles"   Asthma    as a child    Patient Active Problem List   Diagnosis Date Noted   Closed left pilon fracture, initial encounter 11/22/2017    Past Surgical History:  Procedure Laterality Date   APPENDECTOMY     OPEN REDUCTION INTERNAL FIXATION (ORIF) TIBIA/FIBULA FRACTURE Left  11/24/2017   Procedure: OPEN REDUCTION INTERNAL FIXATION (ORIF) LEFT PILON FRACTURE;  Surgeon: Roby Lofts, MD;  Location: [facility] OR;  Service: Orthopedics;  Laterality: Left;   ORIF PILON FRACTURE Left 11/24/2017       No family history on file.  Social History   Tobacco Use   Smoking status: Every Day    Packs/day: 1.00    Years: 24.00    Pack years: 24.00    Types: Cigarettes   Smokeless tobacco: Never  Vaping Use   Vaping Use: Never used  Substance Use Topics   Alcohol use: No   Drug use: No    Home Medications Prior to Admission medications   Medication Sig Start Date End Date Taking? Authorizing Provider  amoxicillin (AMOXIL) 500 MG capsule Take 1 capsule (500 mg total) by mouth 3 (three) times daily. 09/07/20  Yes Koleen Distance, MD  ketorolac (TORADOL) 10 MG tablet Take 1 tablet (10 mg total) by mouth every 6 (six) hours as needed. 09/07/20  Yes Koleen Distance, MD    Allergies    Chlorpheniramine  Review of Systems   Review of Systems  Constitutional:  Negative for chills, fever and weight loss.  HENT:  Negative for congestion, drooling, ear pain, facial swelling and sore throat.   Eyes:  Negative for pain and visual disturbance.  Respiratory:  Negative for cough and shortness of breath.   Cardiovascular:  Negative for chest pain and palpitations.  Gastrointestinal:  Negative for abdominal pain, bowel  incontinence and vomiting.  Genitourinary:  Negative for bladder incontinence, dysuria and hematuria.  Musculoskeletal:  Positive for back pain. Negative for arthralgias and neck pain.  Skin:  Negative for color change and rash.  Neurological:  Negative for tingling, seizures, syncope, weakness, numbness and paresthesias.  All other systems reviewed and are negative.  Physical Exam Updated Vital Signs BP 110/77 (BP Location: Right Arm)   Pulse 71   Temp 98.3 F (36.8 C) (Oral)   Resp 14   SpO2 100%   Physical Exam Vitals and nursing note reviewed.   Constitutional:      Appearance: Normal appearance.  HENT:     Head: Normocephalic and atraumatic.     Mouth/Throat:     Mouth: Mucous membranes are moist.     Pharynx: Oropharynx is clear.      Comments: Tooth #10 is decayed with gingival swelling around that.  Overall, there is poor dentition somewhat diffusely Eyes:     Conjunctiva/sclera: Conjunctivae normal.  Pulmonary:     Effort: Pulmonary effort is normal. No respiratory distress.  Musculoskeletal:        General: No deformity. Normal range of motion.     Cervical back: Normal range of motion.     Lumbar back: No deformity.       Back:     Comments: Mild and diffuse tenderness about the low lumbar spine.  Range of motion is limited for all, but extension provokes most of his pain.  Skin:    General: Skin is warm and dry.  Neurological:     General: No focal deficit present.     Mental Status: He is alert and oriented to person, place, and time. Mental status is at baseline.     Sensory: Sensation is intact.     Motor: No weakness.     Gait: Gait is intact.  Psychiatric:        Mood and Affect: Mood normal.    ED Results / Procedures / Treatments   Labs (all labs ordered are listed, but only abnormal results are displayed) Labs Reviewed - No data to display  EKG None  Radiology No results found.  Procedures Procedures   Medications Ordered in ED Medications  amoxicillin (AMOXIL) capsule 500 mg (has no administration in time range)  ketorolac (TORADOL) tablet 10 mg (has no administration in time range)    ED Course  I have reviewed the triage vital signs and the nursing notes.  Pertinent labs & imaging results that were available during my care of the patient were reviewed by me and considered in my medical decision making (see chart for details).    MDM Rules/Calculators/A&P                          Despina Hick presents with low back pain that is likely caused from his occupational activities.   No red flag symptoms.  He was actually x-rayed yesterday at an outside hospital, but he left prior to his full evaluation.  No imaging indication at this point.  He was advised on symptomatic management.  For his dental pain, I have written an antibiotic.  He was given dental resources. Final Clinical Impression(s) / ED Diagnoses Final diagnoses:  Dental infection  Acute bilateral low back pain without sciatica    Rx / DC Orders ED Discharge Orders          Ordered    amoxicillin (AMOXIL) 500 MG capsule  3 times daily        09/07/20 1121    ketorolac (TORADOL) 10 MG tablet  Every 6 hours PRN        09/07/20 1121             Koleen Distance, MD 09/07/20 1128

## 2020-09-07 NOTE — ED Triage Notes (Signed)
Pt reports lower back since since Thursday. States he believes he "pulled something" with heavy lifting at work.  Also reports L upper dental pain since yesterday.

## 2020-09-18 ENCOUNTER — Other Ambulatory Visit: Payer: Self-pay

## 2020-09-18 ENCOUNTER — Emergency Department (HOSPITAL_COMMUNITY)
Admission: EM | Admit: 2020-09-18 | Discharge: 2020-09-18 | Disposition: A | Discharge status: Home / Self Care | Payer: Self-pay | Attending: Emergency Medicine | Admitting: Emergency Medicine

## 2020-09-18 ENCOUNTER — Encounter (HOSPITAL_COMMUNITY): Payer: Self-pay

## 2020-09-18 DIAGNOSIS — K0889 Other specified disorders of teeth and supporting structures: Secondary | ICD-10-CM | POA: Insufficient documentation

## 2020-09-18 DIAGNOSIS — Z5321 Procedure and treatment not carried out due to patient leaving prior to being seen by health care provider: Secondary | ICD-10-CM | POA: Insufficient documentation

## 2020-09-18 NOTE — ED Notes (Addendum)
No answer when called to room

## 2020-09-18 NOTE — ED Triage Notes (Signed)
Pt reports on-going pain for four upper front teeth. Ibuprofen taken without relief. Reports that he has been seen in the past and prescribed amoxicillin for same.

## 2020-09-18 NOTE — ED Provider Notes (Signed)
  Patient was moved into a room in the computer and I signed up for the patient.  However when the RN tried to retrieve the patient from the ED waiting room, there was no answer.   Anselm Pancoast, PA-C 09/18/20 1612    Derwood Kaplan, MD 09/18/20 1724
